# Patient Record
Sex: Female | Born: 1950 | Race: White | Hispanic: No | Marital: Married | Smoking: Former smoker
Health system: Southern US, Community
[De-identification: ages and names within clinical notes are randomized; demographics above are authoritative.]

## PROBLEM LIST (undated history)

## (undated) DIAGNOSIS — I10 Essential (primary) hypertension: Secondary | ICD-10-CM

## (undated) DIAGNOSIS — N83209 Unspecified ovarian cyst, unspecified side: Secondary | ICD-10-CM

## (undated) DIAGNOSIS — Z9889 Other specified postprocedural states: Secondary | ICD-10-CM

## (undated) DIAGNOSIS — D649 Anemia, unspecified: Secondary | ICD-10-CM

## (undated) DIAGNOSIS — K219 Gastro-esophageal reflux disease without esophagitis: Secondary | ICD-10-CM

## (undated) DIAGNOSIS — E669 Obesity, unspecified: Secondary | ICD-10-CM

## (undated) DIAGNOSIS — T4145XA Adverse effect of unspecified anesthetic, initial encounter: Secondary | ICD-10-CM

## (undated) DIAGNOSIS — K635 Polyp of colon: Secondary | ICD-10-CM

## (undated) DIAGNOSIS — F329 Major depressive disorder, single episode, unspecified: Secondary | ICD-10-CM

## (undated) DIAGNOSIS — K8020 Calculus of gallbladder without cholecystitis without obstruction: Secondary | ICD-10-CM

## (undated) DIAGNOSIS — M797 Fibromyalgia: Secondary | ICD-10-CM

## (undated) DIAGNOSIS — Z889 Allergy status to unspecified drugs, medicaments and biological substances status: Secondary | ICD-10-CM

## (undated) DIAGNOSIS — R112 Nausea with vomiting, unspecified: Secondary | ICD-10-CM

## (undated) DIAGNOSIS — Z86010 Personal history of colonic polyps: Secondary | ICD-10-CM

## (undated) DIAGNOSIS — Z87440 Personal history of urinary (tract) infections: Secondary | ICD-10-CM

## (undated) DIAGNOSIS — F419 Anxiety disorder, unspecified: Secondary | ICD-10-CM

## (undated) DIAGNOSIS — K760 Fatty (change of) liver, not elsewhere classified: Secondary | ICD-10-CM

## (undated) DIAGNOSIS — M5430 Sciatica, unspecified side: Secondary | ICD-10-CM

## (undated) DIAGNOSIS — G4700 Insomnia, unspecified: Secondary | ICD-10-CM

## (undated) DIAGNOSIS — F32A Depression, unspecified: Secondary | ICD-10-CM

## (undated) DIAGNOSIS — Q249 Congenital malformation of heart, unspecified: Secondary | ICD-10-CM

## (undated) DIAGNOSIS — T8859XA Other complications of anesthesia, initial encounter: Secondary | ICD-10-CM

## (undated) DIAGNOSIS — L309 Dermatitis, unspecified: Secondary | ICD-10-CM

## (undated) DIAGNOSIS — K759 Inflammatory liver disease, unspecified: Secondary | ICD-10-CM

## (undated) HISTORY — DX: Anemia, unspecified: D64.9

## (undated) HISTORY — PX: LAPAROSCOPIC OOPHORECTOMY: SUR783

## (undated) HISTORY — DX: Unspecified ovarian cyst, unspecified side: N83.209

## (undated) HISTORY — DX: Congenital malformation of heart, unspecified: Q24.9

## (undated) HISTORY — DX: Fatty (change of) liver, not elsewhere classified: K76.0

## (undated) HISTORY — PX: LAPAROSCOPY: SHX197

## (undated) HISTORY — DX: Major depressive disorder, single episode, unspecified: F32.9

## (undated) HISTORY — DX: Obesity, unspecified: E66.9

## (undated) HISTORY — DX: Polyp of colon: K63.5

## (undated) HISTORY — DX: Insomnia, unspecified: G47.00

## (undated) HISTORY — DX: Fibromyalgia: M79.7

## (undated) HISTORY — DX: Depression, unspecified: F32.A

## (undated) HISTORY — DX: Essential (primary) hypertension: I10

## (undated) HISTORY — DX: Hemochromatosis, unspecified: E83.119

## (undated) HISTORY — DX: Calculus of gallbladder without cholecystitis without obstruction: K80.20

## (undated) HISTORY — DX: Inflammatory liver disease, unspecified: K75.9

---
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
## 1970-03-29 HISTORY — PX: WISDOM TOOTH EXTRACTION: SHX21

## 1992-03-29 HISTORY — PX: APPENDECTOMY: SHX54

## 2000-03-14 ENCOUNTER — Encounter: Payer: Self-pay | Admitting: Surgery

## 2000-03-17 ENCOUNTER — Observation Stay (HOSPITAL_COMMUNITY): Admission: RE | Admit: 2000-03-17 | Discharge: 2000-03-17 | Payer: Self-pay | Admitting: Surgery

## 2000-03-17 ENCOUNTER — Encounter (INDEPENDENT_AMBULATORY_CARE_PROVIDER_SITE_OTHER): Payer: Self-pay | Admitting: Specialist

## 2000-03-17 ENCOUNTER — Encounter: Payer: Self-pay | Admitting: Surgery

## 2000-03-17 HISTORY — PX: LAPAROSCOPIC CHOLECYSTECTOMY: SUR755

## 2000-08-01 ENCOUNTER — Other Ambulatory Visit: Admission: RE | Admit: 2000-08-01 | Discharge: 2000-08-01 | Payer: Self-pay | Admitting: Gynecology

## 2002-09-19 ENCOUNTER — Encounter: Payer: Self-pay | Admitting: Gynecology

## 2002-09-19 ENCOUNTER — Ambulatory Visit (HOSPITAL_COMMUNITY): Admission: RE | Admit: 2002-09-19 | Discharge: 2002-09-19 | Payer: Self-pay | Admitting: Gynecology

## 2003-10-07 ENCOUNTER — Other Ambulatory Visit: Admission: RE | Admit: 2003-10-07 | Discharge: 2003-10-07 | Payer: Self-pay | Admitting: Gynecology

## 2009-05-08 ENCOUNTER — Encounter: Admission: RE | Admit: 2009-05-08 | Discharge: 2009-05-08 | Payer: Self-pay | Admitting: Gynecology

## 2009-08-18 ENCOUNTER — Encounter: Admission: RE | Admit: 2009-08-18 | Discharge: 2009-08-18 | Payer: Self-pay | Admitting: Internal Medicine

## 2009-09-04 ENCOUNTER — Encounter: Admission: RE | Admit: 2009-09-04 | Discharge: 2009-09-04 | Payer: Self-pay | Admitting: Internal Medicine

## 2010-05-14 ENCOUNTER — Other Ambulatory Visit: Payer: Self-pay | Admitting: Gynecology

## 2010-05-14 DIAGNOSIS — R1900 Intra-abdominal and pelvic swelling, mass and lump, unspecified site: Secondary | ICD-10-CM

## 2010-05-14 DIAGNOSIS — R102 Pelvic and perineal pain: Secondary | ICD-10-CM

## 2010-05-19 ENCOUNTER — Other Ambulatory Visit: Payer: Self-pay

## 2010-05-22 ENCOUNTER — Ambulatory Visit
Admission: RE | Admit: 2010-05-22 | Discharge: 2010-05-22 | Disposition: A | Discharge status: Home / Self Care | Payer: 59 | Source: Ambulatory Visit | Attending: Gynecology | Admitting: Gynecology

## 2010-05-22 DIAGNOSIS — R102 Pelvic and perineal pain: Secondary | ICD-10-CM

## 2010-05-22 DIAGNOSIS — R1021 Pelvic and perineal pain right side: Secondary | ICD-10-CM

## 2010-05-22 DIAGNOSIS — R1900 Intra-abdominal and pelvic swelling, mass and lump, unspecified site: Secondary | ICD-10-CM

## 2010-07-08 ENCOUNTER — Encounter (HOSPITAL_COMMUNITY)
Admission: RE | Admit: 2010-07-08 | Discharge: 2010-07-08 | Disposition: A | Discharge status: Home / Self Care | Payer: 59 | Source: Ambulatory Visit | Attending: Obstetrics and Gynecology | Admitting: Obstetrics and Gynecology

## 2010-07-08 LAB — COMPREHENSIVE METABOLIC PANEL
ALT: 35 U/L (ref 0–35)
AST: 42 U/L — ABNORMAL HIGH (ref 0–37)
BUN: 10 mg/dL (ref 6–23)
CO2: 24 mEq/L (ref 19–32)
Chloride: 103 mEq/L (ref 96–112)
GFR calc Af Amer: >60 mL/min (ref >60)
GFR calc non Af Amer: >60 mL/min (ref >60)
Potassium: 3.7 mEq/L (ref 3.5–5.1)
Sodium: 135 mEq/L (ref 135–145)
Total Bilirubin: 0.4 mg/dL (ref 0.3–1.2)
Total Protein: 7.3 g/dL (ref 6.0–8.3)

## 2010-07-08 LAB — CBC
Platelets: 231 10*3/uL (ref 150–400)
RBC: 4.52 MIL/uL (ref 3.87–5.11)
WBC: 9 10*3/uL (ref 4.0–10.5)

## 2010-07-08 LAB — SURGICAL PCR SCREEN
MRSA, PCR: NEGATIVE
Staphylococcus aureus: NEGATIVE

## 2010-07-08 LAB — URINALYSIS, ROUTINE W REFLEX MICROSCOPIC
Hgb urine dipstick: NEGATIVE
Specific Gravity, Urine: <1.005 — ABNORMAL LOW (ref 1.005–1.030)
pH: 6 (ref 5.0–8.0)

## 2010-07-15 ENCOUNTER — Inpatient Hospital Stay (HOSPITAL_COMMUNITY)
Admission: RE | Admit: 2010-07-15 | Discharge: 2010-07-17 | DRG: 743 | Disposition: A | Discharge status: Home / Self Care | Payer: 59 | Source: Ambulatory Visit | Attending: Obstetrics and Gynecology | Admitting: Obstetrics and Gynecology

## 2010-07-15 ENCOUNTER — Other Ambulatory Visit: Payer: Self-pay | Admitting: Obstetrics and Gynecology

## 2010-07-15 DIAGNOSIS — N9489 Other specified conditions associated with female genital organs and menstrual cycle: Secondary | ICD-10-CM | POA: Diagnosis present

## 2010-07-15 DIAGNOSIS — Z01812 Encounter for preprocedural laboratory examination: Secondary | ICD-10-CM

## 2010-07-15 DIAGNOSIS — D279 Benign neoplasm of unspecified ovary: Principal | ICD-10-CM | POA: Diagnosis present

## 2010-07-15 HISTORY — PX: EXPLORATORY LAPAROTOMY: SUR591

## 2010-07-15 LAB — HEMOGLOBIN AND HEMATOCRIT, BLOOD
HCT: 37.2 % (ref 36.0–46.0)
Hemoglobin: 13 g/dL (ref 12.0–15.0)

## 2010-07-16 LAB — CBC
HCT: 35.3 % — ABNORMAL LOW (ref 36.0–46.0)
MCHC: 33.4 g/dL (ref 30.0–36.0)
RDW: 12.2 % (ref 11.5–15.5)

## 2010-07-17 NOTE — Op Note (Signed)
NAME:  Miranda Stark, Miranda Stark                 ACCOUNT NO.:  [id number]  MEDICAL RECORD NO.:  [id number]           PATIENT TYPE:  I  LOCATION:  9316                          FACILITY:  [facility]  PHYSICIAN:  Miguel Aschoff, M.D.       DATE OF BIRTH:  Aug 23, 1950  DATE OF PROCEDURE:  07/15/2010 DATE OF DISCHARGE:                              OPERATIVE REPORT   PREOPERATIVE DIAGNOSIS:  A 6-cm right adnexal mass.  POSTOPERATIVE DIAGNOSIS:  A 6-cm right adnexal mass.  PROCEDURES: 1. Exploratory laparotomy. 2. Lysis of adhesions. 3. Bilateral salpingo-oophorectomy.  SURGEON:  Miguel Aschoff, MD  ASSISTANT:  Luvenia Redden, MD  ANESTHESIA:  General.  COMPLICATIONS:  None.  JUSTIFICATION:  The patient is a 60 year old white female, noted to have what appeared to be a simple cyst involving the right ovary which had been followed, however, the cyst has progressed in size and now it reached 6 cm.  The patient was evaluated on an outpatient basis.  No other abnormalities were noted during the evaluation.  Her CA-125 was noted to be within normal limits.  However, because of her age and persistence of this mass, she is now going to the operating room to undergo exploratory laparotomy and bilateral salpingo-oophorectomy to ensure that this does not represent significant ovarian neoplasia. Risks and benefits of this procedure were discussed with the patient. Informed consent has been obtained.  DESCRIPTION OF PROCEDURE:  The patient was taken to the operating room and placed in the supine position.  General anesthesia was administered without difficulty.  She was then prepped and draped in the usual sterile fashion.  Foley catheter was inserted.  Once this was done, attention was directed to the abdomen where a midline incision was made, extending from the symphysis pubis to the umbilicus.  This was extended out through the subcutaneous tissue with bleeding points being clamped and coagulated as they were  encountered.  The fascia was then identified and incised vertically, it was then separated from the underlying rectus muscles.  The rectus muscles were divided in the midline.  The peritoneum found and entered carefully, avoiding underlying structures. On entering the peritoneum, washings were taken and sent for cytology. Self-retaining retractor was then placed through the wound and the viscera packed out of pelvis.  It was obvious at this point, the patient had a smooth again 6-7 cm mass involving the right ovary.  There were adhesions of the appendices epiploicae onto the mass and the mass was adherent to the lateral pelvic sidewall on the posterior surface of the ovary.  There were no external excrescences noted.  No other abnormalities found.  At this point, the adhesions holding the ovary to the appendices epiploicae were taken down without difficulty and the ovary was freed from the sidewall and then an avascular region between the infundibulopelvic ligament and round ligament space was opened.  The ureter was identified with the ureter being out of field.  The infundibulopelvic ligament was doubly clamped, cut, and doubly ligated using ligatures of 0 Vicryl.  Dissection was then carried out medially, freeing the residual tissue, holding the  ovary to the lateral pelvic sidewall without difficulty.  The specimen was then freed intact and sent for histologic study.  Attention was then directed to the left side, again this was very small ovary, again adherent to the lateral pelvic sidewall, again appendices epiploicae were adherent to this mass and to the sidewall, and it was necessary to lyse these adhesions before proceeding with the left oophorectomy.  Once the adhesions were lysed again, the space between the round ligament and the infundibulopelvic ligament was opened.  The ureter identified and again the infundibulopelvic ligament was then clamped, cut, and doubly ligated using  ligatures of 0 Vicryl.  Once again, the dissection was carried medially and freeing the ovary and tube from the pelvic sidewall and peritoneum again with care to avoid any injury to the great vessels or the ureter.  Once the ovary was freed, inspection was made for hemostasis.  There were 2 areas of oozing noted.  These were promptly brought under control with electrocautery.  At this point with excellent hemostasis, there were no other abnormalities being noted.  The pelvis was irrigated with warm saline.  Lap counts were taken and found to be correct and the abdomen was closed.  The parietal peritoneum was closed using running continuous 0 Vicryl suture.  The fascia was closed using a 0 PDS double-stranded suture in a continuous running fashion.  The subcutaneous tissue was closed using interrupted 2-0 plain gut and skin incision was closed using staples.  Estimated blood loss was less than 100 mL.  The patient tolerated the procedure well and went to the recovery room in satisfactory condition.     Miguel Aschoff, M.D.     AR/MEDQ  D:  07/15/2010  T:  07/15/2010  Job:  [id number]  Electronically Signed by Miguel Aschoff M.D. on 07/17/2010 01:53:24 PM

## 2010-07-21 NOTE — Discharge Summary (Signed)
Miranda Stark, Miranda Stark                 ACCOUNT NO.:  [id number]  MEDICAL RECORD NO.:  [id number]           PATIENT TYPE:  I  LOCATION:  9316                          FACILITY:  [facility]  PHYSICIAN:  Miguel Aschoff, M.D.       DATE OF BIRTH:  1950/04/16  DATE OF ADMISSION:  07/15/2010 DATE OF DISCHARGE:  07/17/2010                              DISCHARGE SUMMARY   ADMISSION DIAGNOSIS:  Complex right adnexal mass.  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  OPERATIONS AND PROCEDURES:  Exploratory laparotomy, lysis of adhesions, bilateral salpingo-oophorectomy, general anesthesia.  BRIEF HISTORY:  The patient is a 60 year old white female, patient of Dr. Lodema Hong, who was noted on examination to have a fullness in the right adnexa which on ultrasound proved to be a cystic mass involving the right ovary.  This had been observed over a period of time with CA- 125 levels which were normal.  However on the patient's most recent exam, this mass was noted to have increased in size to 7 cm.  Because of it being now felt to be an ovarian neoplasm and not representing any simple kind of ovarian cyst, it was felt that exploratory laparotomy was indicated to rule out significant ovarian neoplasia.  Thus, the patient was therefore brought to the [facility] to undergo exploratory laparotomy.  HOSPITAL COURSE:  Preoperative studies were obtained.  This include admission hemoglobin of 14.5, hematocrit 42.0, white count was 9000 and urinalysis was negative.  PT/PTT were within normal limits. Comprehensive metabolic panel was normal except for slight elevation of the glucose at 122 and the SGOT is 42.  Remaining values were normal. Surgical screening for MRSA and staph aureus were negative.  HOSPITAL COURSE:  Under general anesthesia on July 15, 2010, the patient underwent exploratory laparotomy and the patient was found to have a large smooth mass  involving the right ovary adherent somewhat to the right lateral pelvic sidewall approximately 7-8 cm in size.  This was removed intact and sent for histologic study.  She was noted to have a normal atrophic left ovary that was near to the pelvic sidewall and this was removed also at the time of surgery.  The patient's postoperative course was essentially uncomplicated.  She tolerated with increased ambulation and diet well.  Her hemoglobin remained stable.  By the second postoperative day, she was felt to be in satisfactory condition and will be discharged home.  She is tolerating regular diet on p.o. and pain medications.  The patient was sent home on Tylox 1 every 3 hours as needed for pain.  She was instructed no heavy lifting, place nothing in the vagina, to call if there are any problems such as fever, pain or heavy bleeding.  The patient will be seen back in 4 weeks for followup examination.  The pathology report on the ovarian specimen revealed a 7.3 x 6.5 x 5.0 cystic ovarian mass which on sectioning a microscopic study proved to be a benign serous cystadenofibroma.  There was no evidence of malignancy.  Cytology on the abdominal washings was negative.  The patient was sent home on a regular diet in satisfactory condition. Again she will be seen back in 4 weeks for followup examination.     Miguel Aschoff, M.D.     AR/MEDQ  D:  07/17/2010  T:  07/18/2010  Job:  [id number]  Electronically Signed by Miguel Aschoff M.D. on 07/21/2010 09:32:23 AM

## 2010-08-14 NOTE — Op Note (Signed)
[place]  Patient:    NIMCO, BIVENS                        MRN: [id number] Proc. Date: 03/17/00 Adm. Date:  [id number] Disc. Date: [id number] Attending:  Andre Lefort CC:         Janae Bridgeman. Eloise Harman., M.D.   Operative Report  DATE OF BIRTH:  Feb 27, 1951  PREOPERATIVE DIAGNOSIS:  Biliary colic with gallstones.  POSTOPERATIVE DIAGNOSIS:  Chronic cholecystitis with cholelithiasis.  OPERATION PERFORMED:  Laparoscopic cholecystectomy with intraoperative cholangiogram.  SURGEON:  Sandria Bales. Ezzard Standing, M.D.  ASSISTANTMarcial Pacas E. Earlene Plater, M.D.  ANESTHESIA:  General endotracheal.  ESTIMATED BLOOD LOSS:  Minimal.  INDICATIONS FOR PROCEDURE:  Ms. Bolen is a 60 year old white female who has had some vague epigastric pain with a gallbladder ultrasound which shows gallstones.  She now comes for attempted laparoscopic cholecystectomy. Indications and complications were discussed with the patient preoperatively.  DESCRIPTION OF PROCEDURE:  The patient was placed in a supine position and given a general endotracheal anesthetic as supervised by Dr. Lenard Lance.  She was given 1 gm of Ancef at the initiation of the procedure.  She had PAS stockings in place.  Her abdomen was prepped with Betadine solution and sterilely draped.  An infraumbilical incision was made with sharp dissection carried down to the abdominal cavity.  A 12 mm Hasson trocar was inserted into the abdominal cavity and secured with a 0 Vicryl suture.  A 10 mm 0 degree scope was inserted into the abdominal cavity and abdominal exploration carried out.  The right and left lobes of the liver were unremarkable.  The anterior wall of the stomach was unremarkable.  The bowel was covered primarily with omentum.  There was no other abnormal mass or lesion within the abdomen.  Three additional trocars were placed with a 10 mm Ethicon trocar in the subxiphoid location and a 5 mm  Ethicon trocar in the right midsubcostal location and a 5 mm Ethicon trocar in the right lateral subcostal location. Each trocar site was infiltrated with 0.25% Marcaine.  The gallbladder was then grasped and rotated cephalad.  Dissection carried out first identifying a lymph node associated with the gallbladder.  Then the main cystic artery was actually anterior to the cystic duct.  This was triply endoclipped and divided.  A single clip was placed on the gallbladder side of the cystic duct and then an intraoperative cholangiogram was obtained.  The intraoperative cholangiogram was done using a cut-off taut catheter inserted through the side of the cut cystic duct.  Half strength Hypaque solution was used and injected under C-arm visualization.  I injected about 8 cc of the Hypaque.  This showed free flow of contrast down the cystic duct into the common bile duct into the duodenum and the contrast refluxed up into the hepatic radicals.  There is no filling defect and no mass.  This was felt to be a normal intraoperative cholangiogram.  The taut catheter was then removed.  The cystic duct was then triply endoclipped and divided.  The gallbladder was then sharply and bluntly dissected from the gallbladder bed.  I used primarily hook Bovie coagulation. Prior to complete division of the gallbladder from the gallbladder bed, I visualized the triangle of Calot. There was no bleeding or bile leak from this.  There was no bleeding or bile leak from the gallbladder bed.  The gallbladder was then  divided, delivered through the umbilicus intact and sent to pathology.  The abdomen was then irrigated.  The umbilical trocar site closed with a 0 Vicryl suture.  The skin at each site was closed with a 5-0 Vicryl suture, painted with tincture of benzoin, steri-stripped and sterilely dressed.  The patient tolerated the procedure well and was transported to the recovery room in good condition.  The  sponge and needle counts were correct at the end of the case. DD:  03/17/00 TD:  03/18/00 Job: [id number]

## 2010-11-25 ENCOUNTER — Encounter: Payer: Self-pay | Admitting: Internal Medicine

## 2011-01-01 ENCOUNTER — Encounter: Payer: Self-pay | Admitting: Internal Medicine

## 2011-01-01 ENCOUNTER — Ambulatory Visit (INDEPENDENT_AMBULATORY_CARE_PROVIDER_SITE_OTHER): Payer: 59 | Admitting: Internal Medicine

## 2011-01-01 DIAGNOSIS — K219 Gastro-esophageal reflux disease without esophagitis: Secondary | ICD-10-CM

## 2011-01-01 DIAGNOSIS — Z1211 Encounter for screening for malignant neoplasm of colon: Secondary | ICD-10-CM

## 2011-01-01 DIAGNOSIS — R1319 Other dysphagia: Secondary | ICD-10-CM

## 2011-01-01 DIAGNOSIS — R1310 Dysphagia, unspecified: Secondary | ICD-10-CM

## 2011-01-01 MED ORDER — PEG-KCL-NACL-NASULF-NA ASC-C 100 G PO SOLR: 1.0000 | Freq: Once | ORAL | Status: DC

## 2011-01-01 MED ORDER — DEXLANSOPRAZOLE 60 MG PO CPDR: 60.0000 mg | DELAYED_RELEASE_CAPSULE | Freq: Every day | ORAL | Status: DC

## 2011-01-01 NOTE — Patient Instructions (Signed)
You have been scheduled for an Endoscopy/Colonoscopy with separate instructions given. Your prep kit has been sent to your pharmacy for you to pick up. Dexilant samples have been given to you today take 1 tablet every day. Take Pepcid Complete as needed.

## 2011-01-01 NOTE — Progress Notes (Signed)
  Subjective:    Patient ID: Miranda Stark, female    DOB: Oct 20, 1950, 60 y.o.   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 She has had many years of symptoms. Reflux is worse - has nocturnal throat symptoms with burning and rare fluid regurgitation and abdomen may even burn. She has tried to eliminate eating close to bed. Has had years of reflux, thesenocturnal problems have been x 3-4 years. There may be a slow or uncomfortable swallow at times. Her weight is up lately. Under a lot of stress - 100 yo mother with mental illness problems and that has been a major issue. Mom is placed but some issues with siblings cleaning out hose, etc. Using Pepcid Complete. She has taken Aciphex, Prilosec and Prevacid in past. They lost efficacy over time. She has been on medications since her 30's. Has also been on Nexium.   Review of Systems Positive for allergies, she's been anxious and stressed over her mother situation. Some depressive symptoms, insomnia. Alert his systems are negative at this time.    Objective:   Physical Exam General: Obese Well-developed, well-nourished and in no acute distress Vitals: Reviewed and listed above Eyes:anicteric. Mouth and posterior pharynx: normal.  Neck: supple w/o thyromegaly or mass.  Lungs: clear. Heart: S1S2, no rubs, murmurs, gallops. Abdomen: obese, soft, non-tender, no hepatosplenomegaly, hernia, or mass and BS+.  Lymphatics: no cervical, Pettit  nodes. Extremities:  no edema Neuro: nonfocal. A&O x 3.  Psych: appropriate mood and  affect.        Assessment & Plan:   1. Dysphagia  Ambulatory referral to Gastroenterology  2. Screening for colon cancer  Ambulatory referral to Gastroenterology   I suspect the mild dysphagia is related to GERD. Trial of PPI samples with Dexilant. EGD with possible esophageal dilation. Risks benefits and indications were explained.  She is routine risk for colorectal cancer, screening colonoscopy  is recommended and she agrees to proceed. Risks benefits and indications are explained.

## 2011-01-21 ENCOUNTER — Telehealth: Payer: Self-pay | Admitting: Internal Medicine

## 2011-01-21 MED ORDER — DEXLANSOPRAZOLE 60 MG PO CPDR: 60.0000 mg | DELAYED_RELEASE_CAPSULE | Freq: Every day | ORAL | Status: AC

## 2011-01-21 NOTE — Telephone Encounter (Signed)
At last office visit patient was given Dexilant sample to try. Patient calling today for a prescription. Medication sent in the pharmacy, #30 with 5 refills. Dr. Leone Payor patient asked to speak directly to you about a previous conversation you had.

## 2011-01-26 NOTE — Telephone Encounter (Signed)
I called patient - personal discussion about scouting.

## 2011-02-03 ENCOUNTER — Ambulatory Visit (AMBULATORY_SURGERY_CENTER): Payer: 59 | Admitting: Internal Medicine

## 2011-02-03 ENCOUNTER — Encounter: Payer: Self-pay | Admitting: Internal Medicine

## 2011-02-03 DIAGNOSIS — R1319 Other dysphagia: Secondary | ICD-10-CM

## 2011-02-03 DIAGNOSIS — K219 Gastro-esophageal reflux disease without esophagitis: Secondary | ICD-10-CM

## 2011-02-03 DIAGNOSIS — D126 Benign neoplasm of colon, unspecified: Secondary | ICD-10-CM

## 2011-02-03 DIAGNOSIS — Z1211 Encounter for screening for malignant neoplasm of colon: Secondary | ICD-10-CM

## 2011-02-03 DIAGNOSIS — R1310 Dysphagia, unspecified: Secondary | ICD-10-CM

## 2011-02-03 HISTORY — PX: COLONOSCOPY: SHX174

## 2011-02-03 HISTORY — PX: UPPER GASTROINTESTINAL ENDOSCOPY: SHX188

## 2011-02-03 MED ORDER — SODIUM CHLORIDE 0.9 % IV SOLN: 500.0000 mL | INTRAVENOUS | Status: DC

## 2011-02-03 NOTE — Progress Notes (Signed)
No complaints noted in the recovery room. maw 

## 2011-02-03 NOTE — Patient Instructions (Addendum)
1) The esophagus, stomach and small intestine looks normal. Given your swallowing problems I went ahead and performed a dilation of the esophagus. Please follow the post dilation instructions the nurse did you. 2) Continue the Dexilant to treat GERD and heartburn. When you need a refill I would consider trying the lower dose of 30 mg. 3) I removed one very small polyp today. The remainder of the colonoscopy was notable for hemorrhoids only.This small polyp is almost certainly benign and I will confirm that with a letter and notification about the timing of repeat colonoscopy. Iva Boop, MD, [facility]   Resume your prior medications today.  Please follow the green and blue discharge instruction sheets the rest of the day.  Please follow the dilation diet the rest of the day also.  Call if any questions or concerns.

## 2011-02-04 ENCOUNTER — Telehealth: Payer: Self-pay | Admitting: *Deleted

## 2011-02-04 NOTE — Telephone Encounter (Signed)

## 2011-02-10 ENCOUNTER — Encounter: Payer: Self-pay | Admitting: Internal Medicine

## 2011-02-10 DIAGNOSIS — Z86010 Personal history of colonic polyps: Secondary | ICD-10-CM | POA: Insufficient documentation

## 2011-06-11 IMAGING — US US PELVIS COMPLETE
1 series · 14 of 25 positions shown · non-contrast
Comparison: None

CLINICAL DATA: Fullness in the right adnexa on pelvic exam,
hysterectomy, patient on hormone replacement therapy

TRANSABDOMINAL AND TRANSVAGINAL ULTRASOUND OF PELVIS
TECHNIQUE: Both transabdominal and transvaginal ultrasound
examinations of the pelvis were performed including evaluation of
the uterus, ovaries, adnexal regions, and pelvic cul-de-sac.

[Series 1: us pelvis complete · 0.30mm/px · 14 of 37 slices shown]
[im 1/37]
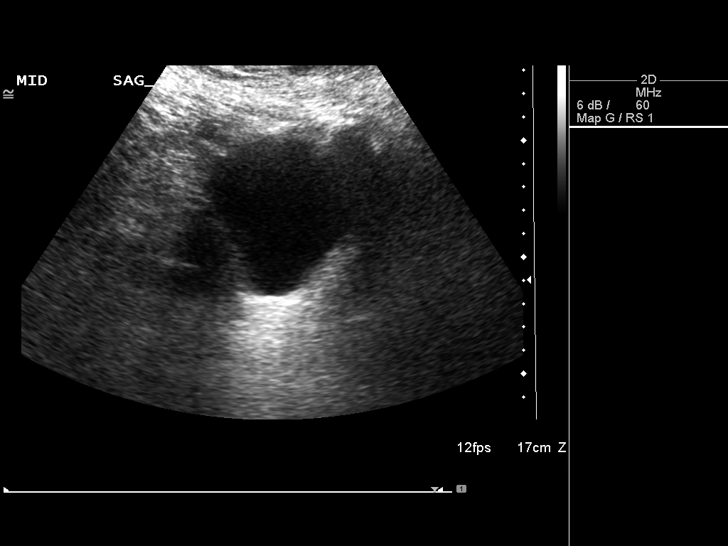
[im 4/37]
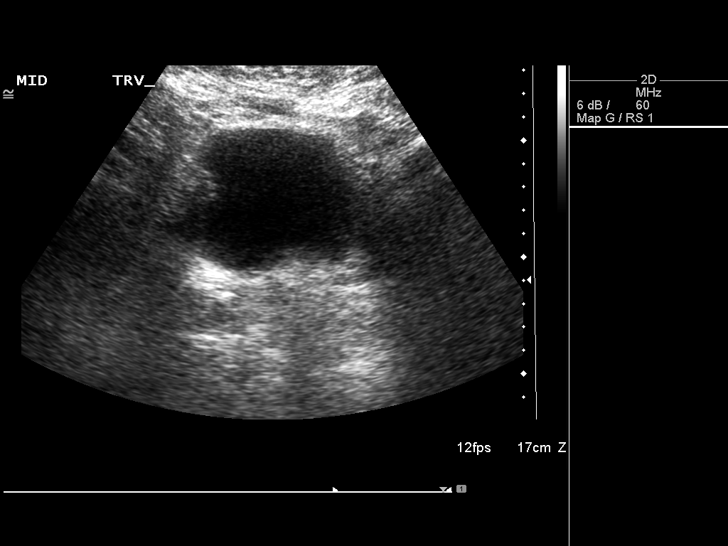
[im 7/37]
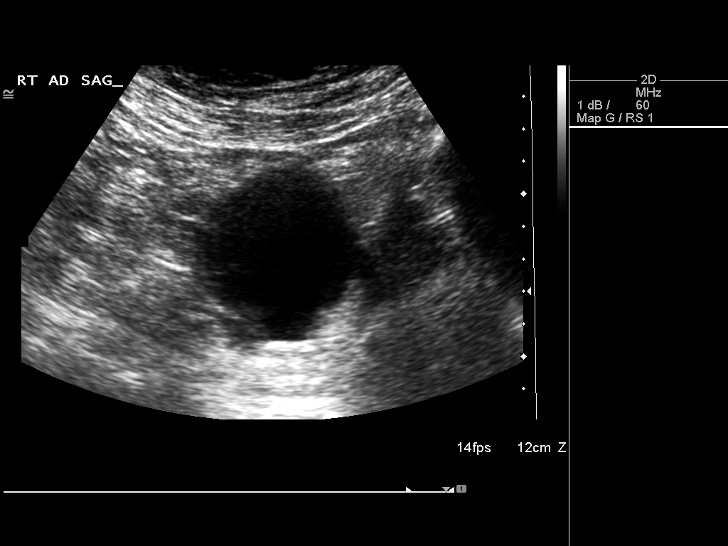
[im 10/37]
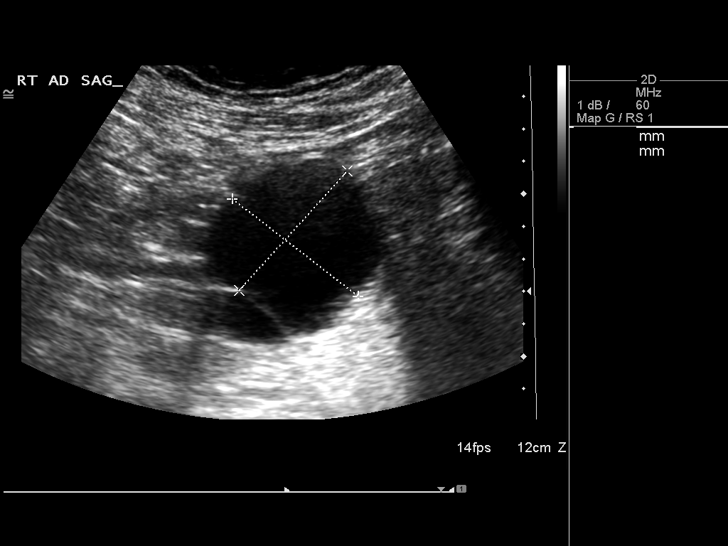
[im 13/37]
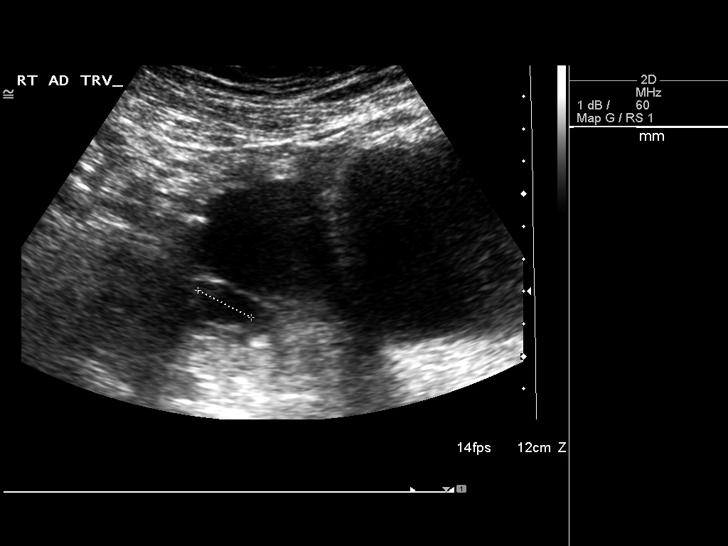
[im 14/37]
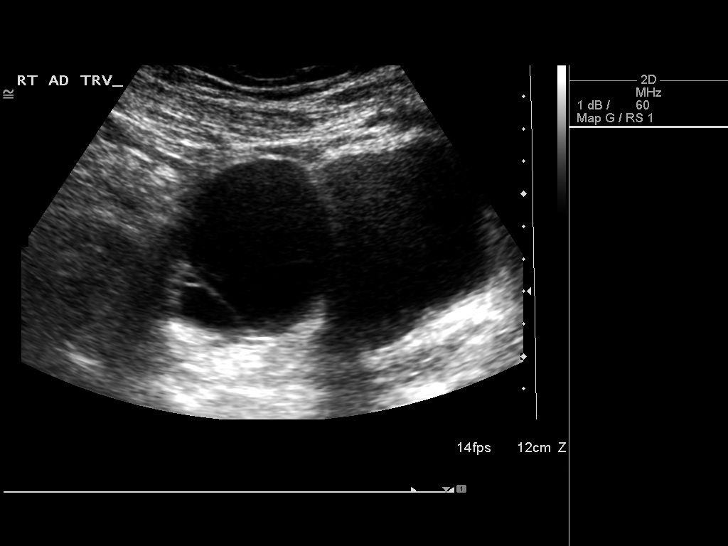
[im 17/37]
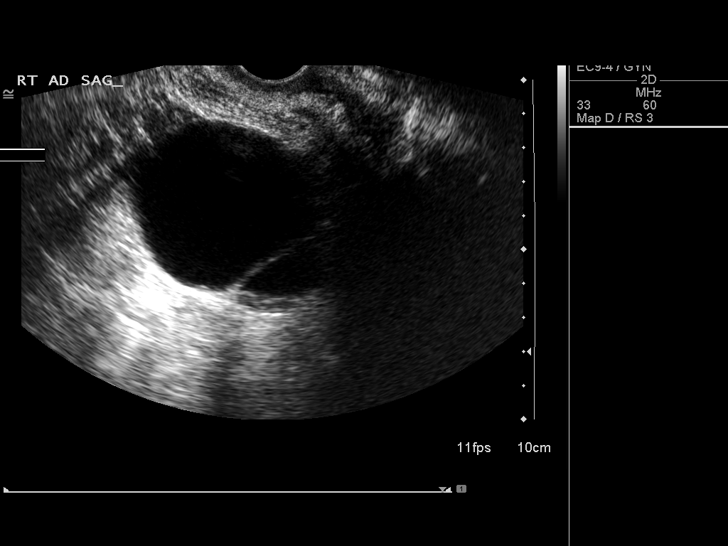
[im 20/37]
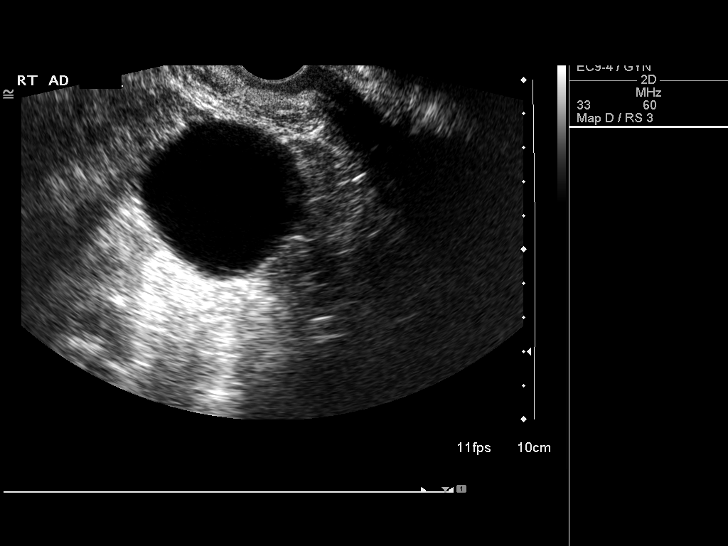
[im 23/37]
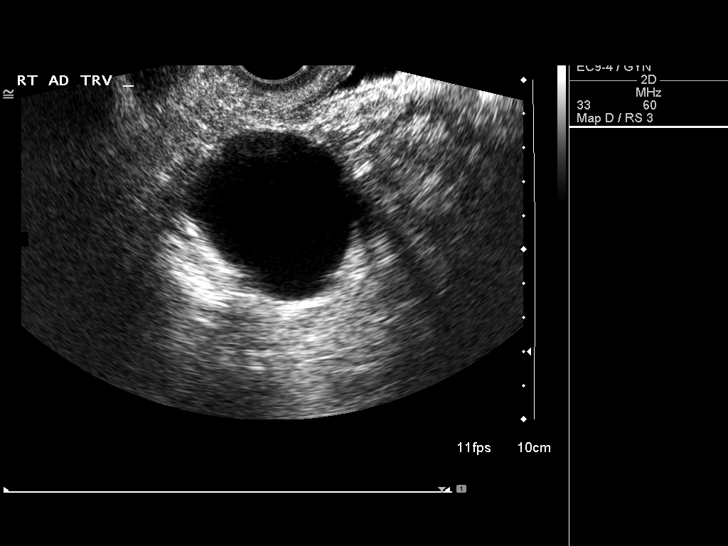
[im 25/37]
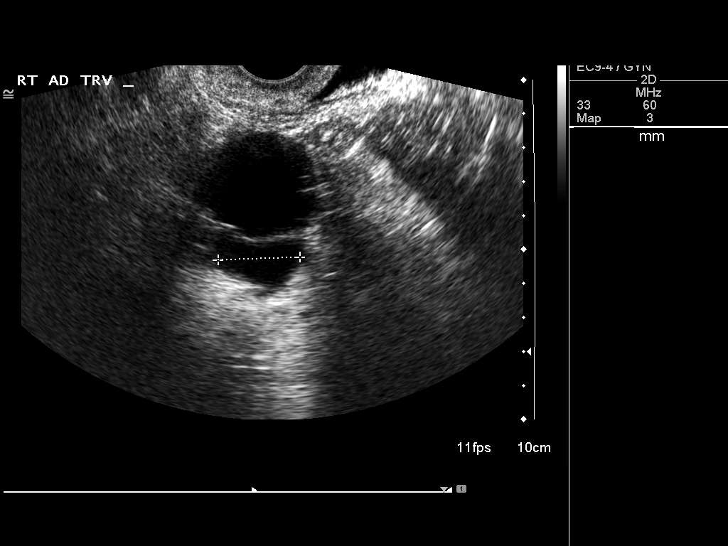
[im 28/37]
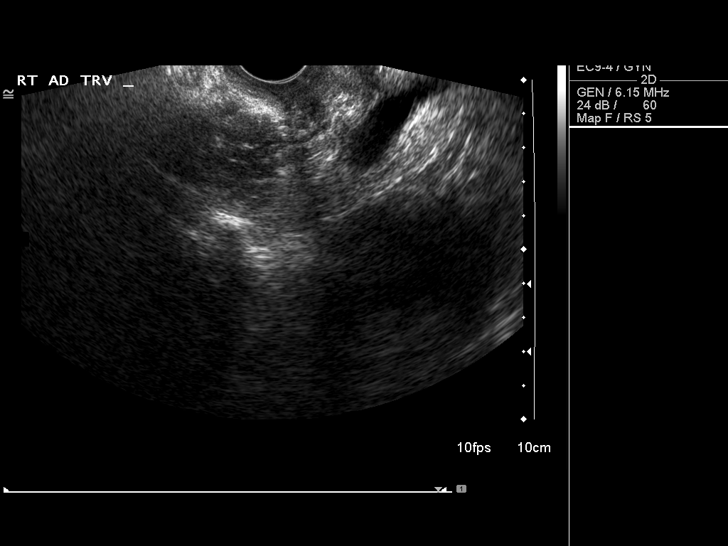
[im 31/37]
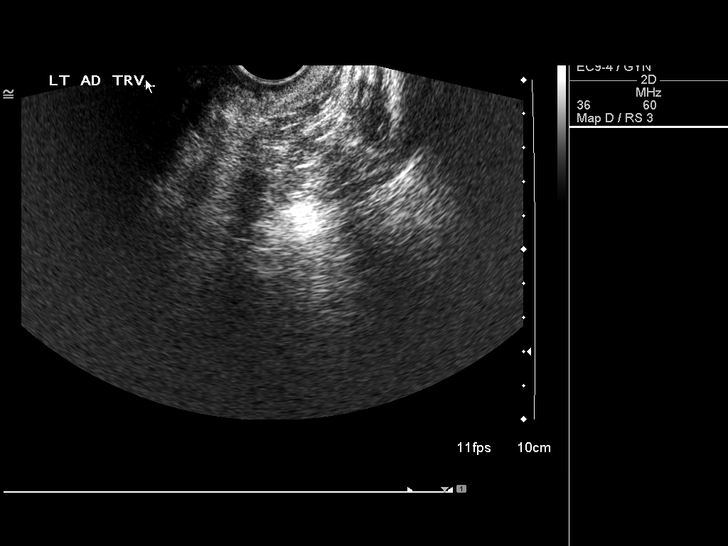
[im 34/37]
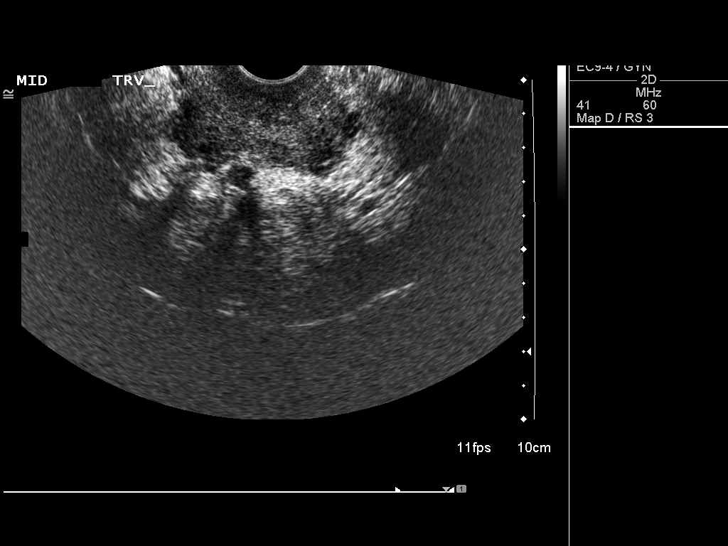
[im 37/37]
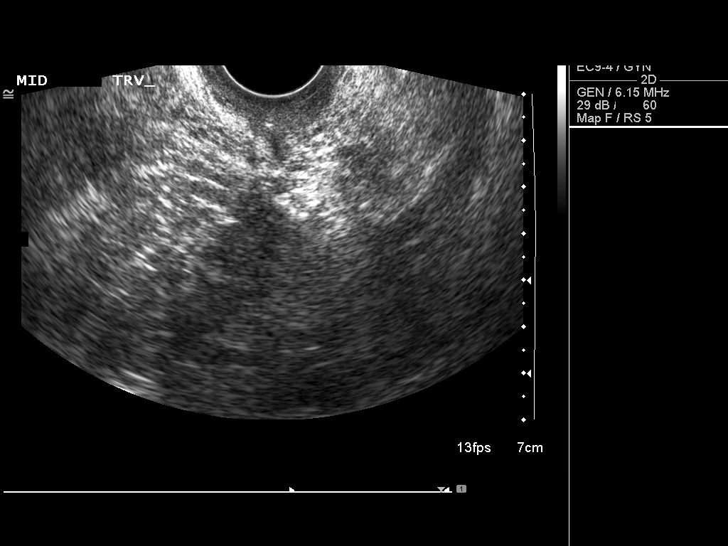

[14 of 25 positions shown; findings below may reference images not displayed]

FINDINGS: Uterus: The uterus has been resected previously.

Endometrium:Not applicable

Right Ovary :Two cystic structures are noted in the right adnexa.
The larger measures 5.2 x 4.7 x 4.9 cm without evidence of
complicating features.  The smaller cystic structure measures 2.5 x
1.4 x 2.4 cm, also without evidence of a complicating features.  No
solid component is seen.

Left Ovary :The left ovary is not visualized .

Other Findings:  No free fluid is seen.
IMPRESSION: Two adjacent right adnexal cysts, the larger measuring 5.2 cm
without complicating features.

## 2012-11-02 ENCOUNTER — Encounter: Payer: Self-pay | Admitting: Internal Medicine

## 2012-11-03 ENCOUNTER — Other Ambulatory Visit: Payer: 59

## 2012-11-03 ENCOUNTER — Ambulatory Visit (INDEPENDENT_AMBULATORY_CARE_PROVIDER_SITE_OTHER): Payer: 59 | Admitting: Internal Medicine

## 2012-11-03 ENCOUNTER — Encounter: Payer: Self-pay | Admitting: Internal Medicine

## 2012-11-03 VITALS — BP 312/86 | HR 90 | Ht 64.0 in | Wt 196.0 lb

## 2012-11-03 DIAGNOSIS — J309 Allergic rhinitis, unspecified: Secondary | ICD-10-CM

## 2012-11-03 DIAGNOSIS — J441 Chronic obstructive pulmonary disease with (acute) exacerbation: Secondary | ICD-10-CM

## 2012-11-03 NOTE — Patient Instructions (Addendum)
Ok to continue azelastine and a nasal steroid like Rhinocort or otc Nasacort  Order- lab- Allergy Profile, Food IgE profile   Dx allergic rhinitis

## 2012-11-03 NOTE — Progress Notes (Signed)
11/03/12- 61 yoFformer smoker Referred by Dr. Pearson Grippe.  recurring congestion, runny nose, and nonprod cough.  Was social smoker quitting 2 years ago. Long history of seasonal allergic rhinitis without asthma. Was on allergy vaccine for 6 or 8 years by Dr. Rema Jasmine in the 1980s. Starting in October of 2013 she has had recurrent rhinorrhea, nasal congestion and green discharge. Episodes clearer but returned every few months. A similar pattern in the 1980s seemed to respond to allergy vaccine. She had spent a year or more renovating a house with associated dust exposure. Still has some hacking cough. Has tried nasal sprays and antihistamines. Recently using Claritin, Azelastine nasal spray and Rhinocort nasal spray. Using Neti pot. On doxycycline now for RMSF after tick bite. History of eczema. Beef and peas cause nausea. No sensitivity to insect stings, latex, aspirin. Most recent prednisone was in June of 2014. Married, retired Engineer, civil (consulting). Mother was broadly atopic, especially to foods. Prior to Admission medications   Medication Sig Start Date End Date Taking? Authorizing Provider  aspirin 81 MG tablet Take 81 mg by mouth daily.     Yes Historical Provider, MD  azelastine (ASTELIN) 137 MCG/SPRAY nasal spray Place 1 spray into the nose 2 (two) times daily. Use in each nostril as directed   Yes Historical Provider, MD  budesonide (RHINOCORT AQUA) 32 MCG/ACT nasal spray Place 1 spray into the nose daily.    Yes Historical Provider, MD  [facility] CINNAMON PO Take 2 capsules by mouth daily.   Yes Historical Provider, MD  cyclobenzaprine (FLEXERIL) 10 MG tablet Take 10 mg by mouth as needed.     Yes Historical Provider, MD  Famotidine-Ca Carb-Mag Hydrox (PEPCID COMPLETE PO) Take by mouth as needed.    Yes Historical Provider, MD  loratadine (CLARITIN) 10 MG tablet Take 10 mg by mouth daily.     Yes Historical Provider, MD  LORazepam (ATIVAN) 1 MG tablet Take 1 mg by mouth at bedtime as needed.    Yes Historical  Provider, MD  losartan (COZAAR) 50 MG tablet Take 50 mg by mouth daily.     Yes Historical Provider, MD  metFORMIN (GLUCOPHAGE) 500 MG tablet Take 0.5 tablets by mouth daily as needed.   Yes Historical Provider, MD  b complex vitamins tablet Take 1 tablet by mouth. Takes only Oct - April    Historical Provider, MD  Cetirizine HCl (ZYRTEC ALLERGY PO) Take by mouth.     Historical Provider, MD  Cholecalciferol (VITAMIN D) 2000 UNITS tablet Take 2,000 Units by mouth. Takes only Oct - April    Historical Provider, MD   Past Medical History  Diagnosis Date  . Insomnia   . Ovarian cyst   . Fatty liver   . HTN (hypertension)   . Allergic rhinitis   . Hepatitis   . Anemia   . Cardiac arrhythmia due to congenital heart disease   . Depression   . Fibromyalgia   . Gallstones   . Obesity    Past Surgical History  Procedure Laterality Date  . Laparoscopic cholecystectomy  03/17/00    with intraoperative cholangiogram.  . Exploratory laparotomy   07/15/2010      Lysis of adhesions, Bilateral salpingo-oophorectomy.   Marland Kitchen Appendectomy  1994  . Laparoscopic oophorectomy    . Colonoscopy  02/03/11    diminutive adenoma and hemorrhoids  . Upper gastrointestinal endoscopy  02/03/11    54 Fr Maloney dilation (looked normal but dysphagia)   Family History  Problem Relation Age of  Onset  . Diabetes Mother     sister  . Hypertension Mother     sister  . Lung disease Mother   . Osteoporosis Mother     brother  . Basal cell carcinoma Mother   . Mental illness Mother   . Kidney failure Brother   . Sleep apnea Sister     mother   History   Social History  . Marital Status: Married    Spouse Name: N/A    Number of Children: 1  . Years of Education: N/A   Occupational History  . retired    Social History Main Topics  . Smoking status: Former Smoker -- 0.20 packs/day for 8 years    Types: Cigarettes    Quit date: 03/29/2010  . Smokeless tobacco: Never Used  . Alcohol Use: Yes      Comment: ocassionaly  . Drug Use: No  . Sexual Activity: Not on file   Other Topics Concern  . Not on file   Social History Narrative   1-2 caffeine drinks a day    ROS-see HPI Constitutional:   No-   weight loss, night sweats, fevers, chills, fatigue, lassitude. HEENT:   No-  headaches, difficulty swallowing, tooth/dental problems, sore throat,       No-  sneezing, itching, ear ache, nasal congestion, post nasal drip,  CV:  No-   chest pain, orthopnea, PND, swelling in lower extremities, anasarca, dizziness, palpitations Resp: No-   shortness of breath with exertion or at rest.              No-   productive cough, + non-productive cough,  No- coughing up of blood.              No-   change in color of mucus.  No- wheezing.   Skin: No-   rash or lesions. GI:  No-   heartburn, indigestion, abdominal pain, nausea, vomiting,  GU:  MS:  No-   joint pain or swelling.   Neuro-     nothing unusual Psych:  No- change in mood or affect. No depression or anxiety.  No memory loss.  OBJ- Physical Exam General- Alert, Oriented, Affect-appropriate, Distress- none acute, overweight Skin- rash-none, lesions- none, excoriation- none Lymphadenopathy- none Head- atraumatic            Eyes- Gross vision intact, PERRLA, conjunctivae and secretions clear            Ears- Hearing, canals-normal            Nose- +turbinate edema, no-Septal dev, mucus, polyps, erosion, perforation             Throat- Mallampati II , mucosa clear , drainage- none, tonsils- atrophic, Neck- flexible , trachea midline, no stridor , thyroid nl, carotid no bruit Chest - symmetrical excursion , unlabored           Heart/CV- RRR , no murmur , no gallop  , no rub, nl s1 s2                           - JVD- none , edema- none, stasis changes- none, varices- none           Lung- clear to P&A, wheeze- none, cough- none , dullness-none, rub- none           Chest wall-  Abd-  Br/ Gen/ Rectal- Not done, not indicated Extrem-  cyanosis- none, clubbing, none, atrophy- none,  strength- nl Neuro- grossly intact to observation

## 2012-11-06 LAB — ALLERGY FULL PROFILE
Allergen,Goose feathers, e70: <0.1 kU/L
Aspergillus fumigatus, m3: <0.1 kU/L
Bermuda Grass: <0.1 kU/L
Candida Albicans: <0.1 kU/L
Curvularia lunata: <0.1 kU/L
D. farinae: <0.1 kU/L
Fescue: <0.1 kU/L
G005 Rye, Perennial: <0.1 kU/L
Lamb's Quarters: <0.1 kU/L
Oak: <0.1 kU/L
Plantain: <0.1 kU/L
Stemphylium Botryosum: <0.1 kU/L
Timothy Grass: <0.1 kU/L

## 2012-11-06 LAB — ALLERGEN FOOD PROFILE SPECIFIC IGE
Apple: <0.1 kU/L
Egg White IgE: <0.1 kU/L
Milk IgE: <0.1 kU/L
Orange: <0.1 kU/L
Peanut IgE: <0.1 kU/L
Shrimp IgE: <0.1 kU/L
Tuna IgE: <0.1 kU/L

## 2012-11-08 NOTE — Progress Notes (Signed)
Quick Note:  Pt aware of results. ______ 

## 2012-11-16 DIAGNOSIS — J310 Chronic rhinitis: Secondary | ICD-10-CM | POA: Insufficient documentation

## 2012-11-16 DIAGNOSIS — J441 Chronic obstructive pulmonary disease with (acute) exacerbation: Secondary | ICD-10-CM | POA: Insufficient documentation

## 2012-11-16 NOTE — Assessment & Plan Note (Signed)
Plan-lab-allergy profiles, watch for recurrent sinusitis, continue present nasal sprays.

## 2012-11-16 NOTE — Assessment & Plan Note (Addendum)
Plan-allergy profiles 

## 2012-12-22 ENCOUNTER — Encounter: Payer: Self-pay | Admitting: Internal Medicine

## 2012-12-22 ENCOUNTER — Ambulatory Visit (INDEPENDENT_AMBULATORY_CARE_PROVIDER_SITE_OTHER): Payer: 59 | Admitting: Internal Medicine

## 2012-12-22 VITALS — BP 126/76 | HR 83 | Ht 64.0 in | Wt 197.2 lb

## 2012-12-22 DIAGNOSIS — J310 Chronic rhinitis: Secondary | ICD-10-CM

## 2012-12-22 DIAGNOSIS — J441 Chronic obstructive pulmonary disease with (acute) exacerbation: Secondary | ICD-10-CM

## 2012-12-22 NOTE — Progress Notes (Signed)
11/03/12- 61 yoFformer smoker Referred by Dr. Pearson Grippe.  recurring congestion, runny nose, and nonprod cough.  Was social smoker quitting 2 years ago. Long history of seasonal allergic rhinitis without asthma. Was on allergy vaccine for 6 or 8 years by Dr. Rema Jasmine in the 1980s. Starting in October of 2013 she has had recurrent rhinorrhea, nasal congestion and green discharge. Episodes clearer but returned every few months. A similar pattern in the 1980s seemed to respond to allergy vaccine. She had spent a year or more renovating a house with associated dust exposure. Still has some hacking cough. Has tried nasal sprays and antihistamines. Recently using Claritin, Azelastine nasal spray and Rhinocort nasal spray. Using Neti pot. On doxycycline now for RMSF after tick bite. History of eczema. Beef and peas cause nausea. No sensitivity to insect stings, latex, aspirin. Most recent prednisone was in June of 2014. Married, retired Engineer, civil (consulting). Mother was broadly atopic, especially to foods.  12/22/12- 11 yoF former smoker followed for allergic rhinitis, chronic bronchitis, complicated by GERD FOLLOWS FOR: review labs in greater detail with patient; denies any flare ups with congestion, cough, or runny nose.  Gets flu vaccine at work No symptoms flare so far this fall. Last winter infections were unusually frequent. Allergy profile 11/03/2012-negative with total IgE 7.8 and no specific elevations. Food IgE allergy profile 11/03/2012-negative for all tested food groups.  ROS-see HPI Constitutional:   No-   weight loss, night sweats, fevers, chills, fatigue, lassitude. HEENT:   No-  headaches, difficulty swallowing, tooth/dental problems, sore throat,       No-  sneezing, itching, ear ache, nasal congestion, post nasal drip,  CV:  No-   chest pain, orthopnea, PND, swelling in lower extremities, anasarca, dizziness, palpitations Resp: No-   shortness of breath with exertion or at rest.              No-    productive cough, + non-productive cough,  No- coughing up of blood.              No-   change in color of mucus.  No- wheezing.   Skin: No-   rash or lesions. GI:  No-   heartburn, indigestion, abdominal pain, nausea, vomiting,  GU:  MS:  No-   joint pain or swelling.   Neuro-     nothing unusual Psych:  No- change in mood or affect. No depression or anxiety.  No memory loss.  OBJ- Physical Exam General- Alert, Oriented, Affect-appropriate, Distress- none acute, overweight Skin- rash-none, lesions- none, excoriation- none Lymphadenopathy- none Head- atraumatic            Eyes- Gross vision intact, PERRLA, conjunctivae and secretions clear            Ears- Hearing, canals-normal            Nose-clear, no-Septal dev, mucus, polyps, erosion, perforation             Throat- Mallampati II , mucosa clear , drainage- none, tonsils- atrophic, Neck- flexible , trachea midline, no stridor , thyroid nl, carotid no bruit Chest - symmetrical excursion , unlabored           Heart/CV- RRR , no murmur , no gallop  , no rub, nl s1 s2                           - JVD- none , edema- none, stasis changes- none, varices- none  Lung- clear to P&A, wheeze- none, cough- none , dullness-none, rub- none           Chest wall-  Abd-  Br/ Gen/ Rectal- Not done, not indicated Extrem- cyanosis- none, clubbing, none, atrophy- none, strength- nl Neuro- grossly intact to observation

## 2012-12-22 NOTE — Patient Instructions (Addendum)
Please call as needed 

## 2012-12-31 NOTE — Assessment & Plan Note (Signed)
She should probably have a series of recurrent upper respiratory infections rather than allergy. We will watch this winter.

## 2012-12-31 NOTE — Assessment & Plan Note (Signed)
Plan-discussed management of viral respiratory infections.

## 2014-05-23 ENCOUNTER — Emergency Department (HOSPITAL_COMMUNITY)
Admission: EM | Admit: 2014-05-23 | Discharge: 2014-05-23 | Disposition: A | Discharge status: Home / Self Care | Payer: 59 | Attending: Emergency Medicine | Admitting: Emergency Medicine

## 2014-05-23 ENCOUNTER — Emergency Department (HOSPITAL_COMMUNITY): Payer: 59

## 2014-05-23 ENCOUNTER — Encounter (HOSPITAL_COMMUNITY): Payer: Self-pay

## 2014-05-23 DIAGNOSIS — Z862 Personal history of diseases of the blood and blood-forming organs and certain disorders involving the immune mechanism: Secondary | ICD-10-CM | POA: Diagnosis not present

## 2014-05-23 DIAGNOSIS — M545 Low back pain: Secondary | ICD-10-CM | POA: Diagnosis present

## 2014-05-23 DIAGNOSIS — M5431 Sciatica, right side: Secondary | ICD-10-CM | POA: Diagnosis not present

## 2014-05-23 DIAGNOSIS — I10 Essential (primary) hypertension: Secondary | ICD-10-CM | POA: Diagnosis not present

## 2014-05-23 DIAGNOSIS — Z8742 Personal history of other diseases of the female genital tract: Secondary | ICD-10-CM | POA: Diagnosis not present

## 2014-05-23 DIAGNOSIS — G4700 Insomnia, unspecified: Secondary | ICD-10-CM | POA: Insufficient documentation

## 2014-05-23 DIAGNOSIS — E669 Obesity, unspecified: Secondary | ICD-10-CM | POA: Insufficient documentation

## 2014-05-23 DIAGNOSIS — Z8619 Personal history of other infectious and parasitic diseases: Secondary | ICD-10-CM | POA: Diagnosis not present

## 2014-05-23 DIAGNOSIS — M797 Fibromyalgia: Secondary | ICD-10-CM | POA: Insufficient documentation

## 2014-05-23 DIAGNOSIS — Z880 Allergy status to penicillin: Secondary | ICD-10-CM | POA: Diagnosis not present

## 2014-05-23 DIAGNOSIS — F329 Major depressive disorder, single episode, unspecified: Secondary | ICD-10-CM | POA: Diagnosis not present

## 2014-05-23 DIAGNOSIS — Q248 Other specified congenital malformations of heart: Secondary | ICD-10-CM | POA: Insufficient documentation

## 2014-05-23 DIAGNOSIS — Z79899 Other long term (current) drug therapy: Secondary | ICD-10-CM | POA: Insufficient documentation

## 2014-05-23 DIAGNOSIS — Z8719 Personal history of other diseases of the digestive system: Secondary | ICD-10-CM | POA: Diagnosis not present

## 2014-05-23 DIAGNOSIS — M25551 Pain in right hip: Secondary | ICD-10-CM | POA: Diagnosis not present

## 2014-05-23 DIAGNOSIS — Z87891 Personal history of nicotine dependence: Secondary | ICD-10-CM | POA: Diagnosis not present

## 2014-05-23 HISTORY — DX: Sciatica, unspecified side: M54.30

## 2014-05-23 MED ORDER — ONDANSETRON HCL 4 MG/2ML IJ SOLN
4.0000 mg | Freq: Once | INTRAMUSCULAR | Status: DC
  Filled 2014-05-23: qty 2

## 2014-05-23 MED ORDER — MORPHINE SULFATE 4 MG/ML IJ SOLN
4.0000 mg | Freq: Once | INTRAMUSCULAR | Status: AC
  Administered 2014-05-23: 4 mg via INTRAVENOUS
  Filled 2014-05-23: qty 1

## 2014-05-23 MED ORDER — OXYCODONE-ACETAMINOPHEN 5-325 MG PO TABS: 2.0000 | ORAL_TABLET | ORAL | Status: DC | PRN

## 2014-05-23 MED ORDER — DIAZEPAM 5 MG/ML IJ SOLN
5.0000 mg | Freq: Once | INTRAMUSCULAR | Status: AC
  Administered 2014-05-23: 5 mg via INTRAVENOUS
  Filled 2014-05-23: qty 2

## 2014-05-23 MED ORDER — OXYCODONE-ACETAMINOPHEN 5-325 MG PO TABS
2.0000 | ORAL_TABLET | Freq: Once | ORAL | Status: AC
  Administered 2014-05-23: 2 via ORAL
  Filled 2014-05-23: qty 2

## 2014-05-23 MED ORDER — DIAZEPAM 5 MG PO TABS: 10.0000 mg | ORAL_TABLET | Freq: Two times a day (BID) | ORAL | Status: DC

## 2014-05-23 NOTE — ED Provider Notes (Signed)
CSN: [id number]     Arrival date & time 05/23/14  0050 History   First MD Initiated Contact with Patient 05/23/14 0104     Chief Complaint  Patient presents with  . Sciatica  . Back Pain  . right hip pain     (Consider location/radiation/quality/duration/timing/severity/associated sxs/prior Treatment) HPI Comments: Patient is a 64 year old female who presents with sudden onset of lower back pain that started this afternoon when she bent down to pick something up. The pain is sharp and severe and radiates down her right leg. The pain is constant. Movement makes the pain worse. 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 She states this feels like an exacerbation of her sciatic nerve pain. Nothing makes the pain better. Patient has tried Robaxin and an expired Percocet for pain without relief. No associated symptoms. No saddle paresthesias or bladder/bowel incontinence.      Past Medical History  Diagnosis Date  . Insomnia   . Ovarian cyst   . Fatty liver   . HTN (hypertension)   . Allergic rhinitis   . Hepatitis   . Anemia   . Cardiac arrhythmia due to congenital heart disease   . Depression   . Fibromyalgia   . Gallstones   . Obesity   . Sciatica    Past Surgical History  Procedure Laterality Date  . Laparoscopic cholecystectomy  03/17/00    with intraoperative cholangiogram.  . Exploratory laparotomy   07/15/2010      Lysis of adhesions, Bilateral salpingo-oophorectomy.   Marland Kitchen Appendectomy  1994  . Laparoscopic oophorectomy    . Colonoscopy  02/03/11    diminutive adenoma and hemorrhoids  . Upper gastrointestinal endoscopy  02/03/11    53 Fr Maloney dilation (looked normal but dysphagia)   Family History  Problem Relation Age of Onset  . Diabetes Mother     sister  . Hypertension Mother     sister  . Lung disease Mother   . Osteoporosis Mother     brother  . Basal cell carcinoma Mother   . Mental illness Mother   . Kidney failure  Brother   . Sleep apnea Sister     mother   History  Substance Use Topics  . Smoking status: Former Smoker -- 0.20 packs/day for 8 years    Types: Cigarettes    Quit date: 03/29/2010  . Smokeless tobacco: Never Used  . Alcohol Use: Yes     Comment: ocassionaly   OB History    No data available     Review of Systems  Constitutional: Negative for fever, chills and fatigue.  HENT: Negative for trouble swallowing.   Eyes: Negative for visual disturbance.  Respiratory: Negative for shortness of breath.   Cardiovascular: Negative for chest pain and palpitations.  Gastrointestinal: Negative for nausea, vomiting, abdominal pain and diarrhea.  Genitourinary: Negative for dysuria and difficulty urinating.  Musculoskeletal: Positive for back pain and arthralgias. Negative for neck pain.  Skin: Negative for color change.  Neurological: Negative for dizziness and weakness.  Psychiatric/Behavioral: Negative for dysphoric mood.      Allergies  Actifed cold-allergy; Ampicillin; Beef-derived products; Darvocet; Darvon compound; Indomethacin; Lorcet; Tofranil-pm; and Ultram  Home Medications   Prior to Admission medications   Medication Sig Start Date End Date Taking? Authorizing Provider  amphetamine-dextroamphetamine (ADDERALL) 5 MG tablet Take 5 mg by mouth daily.   Yes Historical Provider, MD  azelastine (ASTELIN) 137 MCG/SPRAY nasal spray Place 1 spray  into the nose 2 (two) times daily. Use in each nostril as directed   Yes Historical Provider, MD  b complex vitamins tablet Take 1 tablet by mouth. Takes only Oct - April   Yes Historical Provider, MD  Cholecalciferol (VITAMIN D) 2000 UNITS tablet Take 2,000 Units by mouth. Takes only Oct - April   Yes Historical Provider, MD  cyclobenzaprine (FLEXERIL) 10 MG tablet Take 10 mg by mouth as needed for muscle spasms.    Yes Historical Provider, MD  LORazepam (ATIVAN) 1 MG tablet Take 0.5 mg by mouth at bedtime as needed for anxiety.    Yes  Historical Provider, MD  losartan (COZAAR) 50 MG tablet Take 50 mg by mouth daily.     Yes Historical Provider, MD  omeprazole (PRILOSEC OTC) 20 MG tablet Take 20 mg by mouth daily.   Yes Historical Provider, MD  OVER THE COUNTER MEDICATION Take 1 tablet by mouth 2 (two) times daily.   Yes Historical Provider, MD  triamcinolone cream (KENALOG) 0.1 % Apply 1 application topically 2 (two) times daily.   Yes Historical Provider, MD   BP 123/67 mmHg  Pulse 88  Temp(Src) 97.8 F (36.6 C) (Oral)  Resp 18  SpO2 91% Physical Exam  Constitutional: She is oriented to person, place, and time. She appears well-developed and well-nourished. No distress.  HENT:  Head: Normocephalic and atraumatic.  Eyes: Conjunctivae and EOM are normal.  Neck: Normal range of motion.  Cardiovascular: Normal rate and regular rhythm.  Exam reveals no gallop and no friction rub.   No murmur heard. Pulmonary/Chest: Effort normal and breath sounds normal. She has no wheezes. She has no rales. She exhibits no tenderness.  Abdominal: Soft. She exhibits no distension. There is no tenderness. There is no rebound.  Musculoskeletal: Normal range of motion.  Midline lower lumbar tenderness to palpation and right gluteal tenderness to palpation.   Neurological: She is alert and oriented to person, place, and time. Coordination normal.  Lower extremity sensation to light touch equal and intact bilaterally. Speech is goal-oriented. Moves limbs without ataxia.   Skin: Skin is warm and dry.  Psychiatric: She has a normal mood and affect. Her behavior is normal.  Nursing note and vitals reviewed.   ED Course  Procedures (including critical care time) Labs Review Labs Reviewed - No data to display  Imaging Review Dg Lumbar Spine Complete  05/23/2014   CLINICAL DATA:  Sciatica for several months. A pulled muscle in the low back after bending over a few days ago. Pain since then. Radiating to both sides of the groin.  EXAM:  LUMBAR SPINE - COMPLETE 4+ VIEW  COMPARISON:  03/04/2014  FINDINGS: Lumbar spine is convex towards the right. This may be due to patient positioning although muscle spasm could also have this appearance. Normal alignment of the facet joints. Slight anterior subluxation of L4 on L5 is unchanged since prior study likely degenerative. Mild degenerative changes in the thoracic and lumbar spine. No vertebral compression deformities. No focal bone lesion or bone destruction.  IMPRESSION: Convexity lumbar spine towards the right may indicate muscle spasm or patient positioning. Mild degenerative changes. No displaced fractures identified.   Electronically Signed   By: Lucienne Capers M.D.   On: 05/23/2014 02:03   Dg Pelvis 1-2 Views  05/23/2014   CLINICAL DATA:  Back pain and sciatica.  EXAM: PELVIS - 1-2 VIEW  COMPARISON:  CT abdomen and pelvis 09/04/2009  FINDINGS: Pelvis and visualized hips appear intact. No  displaced fractures identified. Degenerative changes in the lower lumbar spine and in both hips. 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 SI joints and symphysis pubis are not displaced.  IMPRESSION: No acute bony abnormalities. Degenerative changes in the lower lumbar spine and hips. 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   Electronically Signed   By: Lucienne Capers M.D.   On: 05/23/2014 02:04     EKG Interpretation None      MDM   Final diagnoses:  Sciatica neuralgia, right    1:26 AM Imaging pending. Patient will have morphine and valium for pain. No neuro deficits. Vitals stable and patient afebrile.   Patient feeling better and will have Percocet and Valium RX. No acute changes on imaging. Patient instructed to follow up with PCP and return to the ED with worsening or concerning symptoms.   Alvina Chou, PA-C 05/23/14 E. Lopez, MD 05/24/14 [phone]

## 2014-05-23 NOTE — Discharge Instructions (Signed)
Take Percocet as needed for pain. Take Valium as needed for muscle spasm. Refer to attached documents for more information. Return to the ED with worsening or concerning symptoms.

## 2014-05-23 NOTE — ED Notes (Signed)
Patient received Fentanyl 150 mcg and Zofran 4 mg IV per EMS

## 2014-05-23 NOTE — ED Notes (Signed)
Pt stated she would like some pain medicine to go home with until she gets her medicine filled.

## 2014-05-23 NOTE — ED Notes (Signed)
Pt reports about 2100 last night, she started to have R groin pain and low back pain.  Pt reports hx of back pain with sciatica since November.  Pt reports pain in R low back, R groin that radiates down to her R leg.  Pt reports taking robaxin and percocet later  which is 64 years old without relief.  Reports receiving 236mcg of fentanyl en route which help tolerate the pain.  Pt is A&Ox 4.  Family at bedside.

## 2014-05-23 NOTE — ED Notes (Signed)
Patient arrives by EMS with complaints of lower back pain, right hip pain with radiation down right leg.  EMS states patient was diaphoretic with pain-tried taking Robaxin and Percocet with no relief prior to calling EMS

## 2014-05-23 NOTE — ED Notes (Signed)
Bed: WA17 Expected date:  Expected time:  Means of arrival:  Comments: EMS back pain

## 2014-05-23 NOTE — ED Notes (Signed)
Patient transported to X-ray 

## 2014-08-07 ENCOUNTER — Other Ambulatory Visit: Payer: Self-pay | Admitting: Neurosurgery

## 2014-08-13 ENCOUNTER — Encounter (HOSPITAL_COMMUNITY): Payer: Self-pay

## 2014-08-13 ENCOUNTER — Encounter (HOSPITAL_COMMUNITY)
Admission: RE | Admit: 2014-08-13 | Discharge: 2014-08-13 | Disposition: A | Discharge status: Home / Self Care | Payer: 59 | Source: Ambulatory Visit | Attending: Neurosurgery | Admitting: Neurosurgery

## 2014-08-13 DIAGNOSIS — K219 Gastro-esophageal reflux disease without esophagitis: Secondary | ICD-10-CM | POA: Diagnosis not present

## 2014-08-13 DIAGNOSIS — M5136 Other intervertebral disc degeneration, lumbar region: Secondary | ICD-10-CM | POA: Diagnosis not present

## 2014-08-13 DIAGNOSIS — M797 Fibromyalgia: Secondary | ICD-10-CM | POA: Diagnosis not present

## 2014-08-13 DIAGNOSIS — I10 Essential (primary) hypertension: Secondary | ICD-10-CM | POA: Diagnosis not present

## 2014-08-13 DIAGNOSIS — M4316 Spondylolisthesis, lumbar region: Secondary | ICD-10-CM | POA: Diagnosis not present

## 2014-08-13 DIAGNOSIS — Z87891 Personal history of nicotine dependence: Secondary | ICD-10-CM | POA: Diagnosis not present

## 2014-08-13 DIAGNOSIS — M4726 Other spondylosis with radiculopathy, lumbar region: Secondary | ICD-10-CM | POA: Diagnosis present

## 2014-08-13 HISTORY — DX: Other complications of anesthesia, initial encounter: T88.59XA

## 2014-08-13 HISTORY — DX: Personal history of colonic polyps: Z86.010

## 2014-08-13 HISTORY — DX: Anxiety disorder, unspecified: F41.9

## 2014-08-13 HISTORY — DX: Gastro-esophageal reflux disease without esophagitis: K21.9

## 2014-08-13 HISTORY — DX: Personal history of urinary (tract) infections: Z87.440

## 2014-08-13 HISTORY — DX: Dermatitis, unspecified: L30.9

## 2014-08-13 HISTORY — DX: Adverse effect of unspecified anesthetic, initial encounter: T41.45XA

## 2014-08-13 HISTORY — DX: Allergy status to unspecified drugs, medicaments and biological substances: Z88.9

## 2014-08-13 HISTORY — DX: Nausea with vomiting, unspecified: R11.2

## 2014-08-13 HISTORY — DX: Other specified postprocedural states: Z98.890

## 2014-08-13 LAB — BASIC METABOLIC PANEL
ANION GAP: 8 (ref 5–15)
BUN: 13 mg/dL (ref 6–20)
CHLORIDE: 108 mmol/L (ref 101–111)
CO2: 27 mmol/L (ref 22–32)
CREATININE: 0.65 mg/dL (ref 0.44–1.00)
Calcium: 9.8 mg/dL (ref 8.9–10.3)
GFR calc Af Amer: >60 mL/min (ref >60)
GFR calc non Af Amer: >60 mL/min (ref >60)
Glucose, Bld: 122 mg/dL — ABNORMAL HIGH (ref 65–99)
Potassium: 4.2 mmol/L (ref 3.5–5.1)
Sodium: 143 mmol/L (ref 135–145)

## 2014-08-13 LAB — SURGICAL PCR SCREEN
MRSA, PCR: NEGATIVE
Staphylococcus aureus: NEGATIVE

## 2014-08-13 LAB — CBC
HEMATOCRIT: 40.7 % (ref 36.0–46.0)
Hemoglobin: 14.2 g/dL (ref 12.0–15.0)
MCH: 32 pg (ref 26.0–34.0)
MCHC: 34.9 g/dL (ref 30.0–36.0)
MCV: 91.7 fL (ref 78.0–100.0)
PLATELETS: 183 10*3/uL (ref 150–400)
RBC: 4.44 MIL/uL (ref 3.87–5.11)
RDW: 12 % (ref 11.5–15.5)
WBC: 6.7 10*3/uL (ref 4.0–10.5)

## 2014-08-13 NOTE — Pre-Procedure Instructions (Signed)
Miranda Stark  08/13/2014   Your procedure is scheduled on:  Thursday, May 19  Report to [facility] Admitting at 0530 AM.  Call this number if you have problems the morning of surgery: [phone]   Remember:   Do not eat food or drink liquids after midnight.Wednesday night   Take these medicines the morning of surgery with A SIP OF WATER: Nexium, Oxycodone if needed for pain   Do not wear jewelry, make-up or nail polish.  Do not wear lotions, powders, or perfumes. You may wear deodorant.  Do not shave 48 hours prior to surgery. Men may shave face and neck.  Do not bring valuables to the hospital.  [facility] is not responsible    for any belongings or valuables.               Contacts, dentures or bridgework may not be worn into surgery.  Leave suitcase in the car. After surgery it may be brought to your room.  For patients admitted to the hospital, discharge time is determined by your   treatment team.                  Special Instructions:  - Preparing for Surgery  Before surgery, you can play an important role.  Because skin is not sterile, your skin needs to be as free of germs as possible.  You can reduce the number of germs on you skin by washing with CHG (chlorahexidine gluconate) soap before surgery.  CHG is an antiseptic cleaner which kills germs and bonds with the skin to continue killing germs even after washing.  Please DO NOT use if you have an allergy to CHG or antibacterial soaps.  If your skin becomes reddened/irritated stop using the CHG and inform your nurse when you arrive at Short Stay.  Do not shave (including legs and underarms) for at least 48 hours prior to the first CHG shower.  You may shave your face.  Please follow these instructions carefully:   1.  Shower with CHG Soap the night before surgery and the   morning of Surgery.  2.  If you choose to wash your hair, wash your hair first as usual with your  normal shampoo.  3.  After  you shampoo, rinse your hair and body thoroughly to remove the  Shampoo.  4.  Use CHG as you would any other liquid soap.  You can apply chg directly   to the skin and wash gently with scrungie or a clean washcloth.  5.  Apply the CHG Soap to your body ONLY FROM THE NECK DOWN.    Do not use on open wounds or open sores.  Avoid contact with your eyes,   ears, mouth and genitals (private parts).  Wash genitals (private parts)   with your normal soap.  6.  Wash thoroughly, paying special attention to the area where your surgery   will be performed.  7.  Thoroughly rinse your body with warm water from the neck down.  8.  DO NOT shower/wash with your normal soap after using and rinsing off   the CHG Soap.  9.  Pat yourself dry with a clean towel.            10.  Wear clean pajamas.            11.  Place clean sheets on your bed the night of your first shower and do not   sleep  with pets.  Day of Surgery  Do not apply any lotions/deoderants the morning of surgery.  Please wear clean clothes to the hospital/surgery center.     Please read over the following fact sheets that you were given: Pain Booklet, Coughing and Deep Breathing, MRSA Information and Surgical Site Infection Prevention

## 2014-08-14 MED ORDER — VANCOMYCIN HCL IN DEXTROSE 1-5 GM/200ML-% IV SOLN
1000.0000 mg | INTRAVENOUS | Status: AC
  Administered 2014-08-15: 1000 mg via INTRAVENOUS
  Filled 2014-08-14: qty 200

## 2014-08-15 ENCOUNTER — Observation Stay (HOSPITAL_COMMUNITY): Payer: 59

## 2014-08-15 ENCOUNTER — Observation Stay (HOSPITAL_COMMUNITY)
Admission: RE | Admit: 2014-08-15 | Discharge: 2014-08-16 | Disposition: A | Discharge status: Home / Self Care | Payer: 59 | Source: Ambulatory Visit | Attending: Neurosurgery | Admitting: Neurosurgery

## 2014-08-15 ENCOUNTER — Encounter (HOSPITAL_COMMUNITY): Payer: Self-pay | Admitting: *Deleted

## 2014-08-15 ENCOUNTER — Ambulatory Visit (HOSPITAL_COMMUNITY): Payer: 59 | Admitting: Anesthesiology

## 2014-08-15 ENCOUNTER — Encounter (HOSPITAL_COMMUNITY)
Admission: RE | Disposition: A | Discharge status: Home / Self Care | Payer: Self-pay | Source: Ambulatory Visit | Attending: Neurosurgery

## 2014-08-15 DIAGNOSIS — M797 Fibromyalgia: Secondary | ICD-10-CM | POA: Insufficient documentation

## 2014-08-15 DIAGNOSIS — M4316 Spondylolisthesis, lumbar region: Secondary | ICD-10-CM | POA: Diagnosis not present

## 2014-08-15 DIAGNOSIS — I10 Essential (primary) hypertension: Secondary | ICD-10-CM | POA: Insufficient documentation

## 2014-08-15 DIAGNOSIS — Z87891 Personal history of nicotine dependence: Secondary | ICD-10-CM | POA: Insufficient documentation

## 2014-08-15 DIAGNOSIS — K219 Gastro-esophageal reflux disease without esophagitis: Secondary | ICD-10-CM | POA: Insufficient documentation

## 2014-08-15 DIAGNOSIS — M5126 Other intervertebral disc displacement, lumbar region: Secondary | ICD-10-CM | POA: Diagnosis present

## 2014-08-15 DIAGNOSIS — Z419 Encounter for procedure for purposes other than remedying health state, unspecified: Secondary | ICD-10-CM

## 2014-08-15 DIAGNOSIS — M5136 Other intervertebral disc degeneration, lumbar region: Secondary | ICD-10-CM | POA: Insufficient documentation

## 2014-08-15 HISTORY — PX: LUMBAR LAMINECTOMY/DECOMPRESSION MICRODISCECTOMY: SHX5026

## 2014-08-15 SURGERY — LUMBAR LAMINECTOMY/DECOMPRESSION MICRODISCECTOMY 1 LEVEL
Anesthesia: General | Laterality: Right

## 2014-08-15 MED ORDER — KETOROLAC TROMETHAMINE 30 MG/ML IJ SOLN
INTRAMUSCULAR | Status: AC
  Filled 2014-08-15: qty 1

## 2014-08-15 MED ORDER — LOSARTAN POTASSIUM 50 MG PO TABS
50.0000 mg | ORAL_TABLET | Freq: Every day | ORAL | Status: DC
  Administered 2014-08-15: 50 mg via ORAL
  Filled 2014-08-15 (×2): qty 1

## 2014-08-15 MED ORDER — MENTHOL 3 MG MT LOZG: 1.0000 | LOZENGE | OROMUCOSAL | Status: DC | PRN

## 2014-08-15 MED ORDER — SODIUM CHLORIDE 0.9 % IR SOLN
Status: DC | PRN
  Administered 2014-08-15: 07:00:00

## 2014-08-15 MED ORDER — AMPHETAMINE-DEXTROAMPHETAMINE 10 MG PO TABS: 5.0000 mg | ORAL_TABLET | Freq: Every day | ORAL | Status: DC

## 2014-08-15 MED ORDER — ONDANSETRON HCL 4 MG/2ML IJ SOLN
INTRAMUSCULAR | Status: DC | PRN
  Administered 2014-08-15 (×2): 4 mg via INTRAVENOUS

## 2014-08-15 MED ORDER — HYDROMORPHONE HCL 1 MG/ML IJ SOLN
0.2500 mg | INTRAMUSCULAR | Status: DC | PRN
  Administered 2014-08-15 (×2): 0.25 mg via INTRAVENOUS

## 2014-08-15 MED ORDER — ONDANSETRON HCL 4 MG/2ML IJ SOLN: 4.0000 mg | Freq: Four times a day (QID) | INTRAMUSCULAR | Status: DC | PRN

## 2014-08-15 MED ORDER — KETOROLAC TROMETHAMINE 30 MG/ML IJ SOLN: 30.0000 mg | Freq: Once | INTRAMUSCULAR | Status: DC | PRN

## 2014-08-15 MED ORDER — LORAZEPAM 0.5 MG PO TABS: 1.0000 mg | ORAL_TABLET | Freq: Every evening | ORAL | Status: DC | PRN

## 2014-08-15 MED ORDER — HYDROXYZINE HCL 50 MG/ML IM SOLN
50.0000 mg | INTRAMUSCULAR | Status: DC | PRN
  Filled 2014-08-15: qty 1

## 2014-08-15 MED ORDER — FENTANYL CITRATE (PF) 100 MCG/2ML IJ SOLN
INTRAMUSCULAR | Status: DC | PRN
  Administered 2014-08-15: 100 ug via INTRAVENOUS

## 2014-08-15 MED ORDER — ROCURONIUM BROMIDE 50 MG/5ML IV SOLN
INTRAVENOUS | Status: AC
  Filled 2014-08-15: qty 1

## 2014-08-15 MED ORDER — SODIUM CHLORIDE 0.9 % IJ SOLN
3.0000 mL | Freq: Two times a day (BID) | INTRAMUSCULAR | Status: DC
  Administered 2014-08-15 (×2): 3 mL via INTRAVENOUS

## 2014-08-15 MED ORDER — CYCLOBENZAPRINE HCL 10 MG PO TABS: 10.0000 mg | ORAL_TABLET | Freq: Three times a day (TID) | ORAL | Status: DC | PRN

## 2014-08-15 MED ORDER — BISACODYL 10 MG RE SUPP: 10.0000 mg | Freq: Every day | RECTAL | Status: DC | PRN

## 2014-08-15 MED ORDER — NEOSTIGMINE METHYLSULFATE 10 MG/10ML IV SOLN
INTRAVENOUS | Status: DC | PRN
  Administered 2014-08-15: 3 mg via INTRAVENOUS

## 2014-08-15 MED ORDER — PHENOL 1.4 % MT LIQD
1.0000 | OROMUCOSAL | Status: DC | PRN
Start: 2014-08-15 — End: 2014-08-16

## 2014-08-15 MED ORDER — OXYCODONE-ACETAMINOPHEN 5-325 MG PO TABS
1.0000 | ORAL_TABLET | ORAL | Status: DC | PRN
  Administered 2014-08-15: 1 via ORAL
  Filled 2014-08-15: qty 1

## 2014-08-15 MED ORDER — LIDOCAINE HCL (CARDIAC) 20 MG/ML IV SOLN
INTRAVENOUS | Status: AC
  Filled 2014-08-15: qty 5

## 2014-08-15 MED ORDER — DEXAMETHASONE SODIUM PHOSPHATE 4 MG/ML IJ SOLN
INTRAMUSCULAR | Status: DC | PRN
  Administered 2014-08-15: 8 mg via INTRAVENOUS

## 2014-08-15 MED ORDER — DEXAMETHASONE SODIUM PHOSPHATE 4 MG/ML IJ SOLN
INTRAMUSCULAR | Status: AC
  Filled 2014-08-15: qty 2

## 2014-08-15 MED ORDER — ROCURONIUM BROMIDE 100 MG/10ML IV SOLN
INTRAVENOUS | Status: DC | PRN
  Administered 2014-08-15: 40 mg via INTRAVENOUS

## 2014-08-15 MED ORDER — KETOROLAC TROMETHAMINE 30 MG/ML IJ SOLN
30.0000 mg | Freq: Four times a day (QID) | INTRAMUSCULAR | Status: DC
  Administered 2014-08-15 – 2014-08-16 (×4): 30 mg via INTRAVENOUS
  Filled 2014-08-15 (×7): qty 1

## 2014-08-15 MED ORDER — ACETAMINOPHEN 650 MG RE SUPP: 650.0000 mg | RECTAL | Status: DC | PRN

## 2014-08-15 MED ORDER — LIDOCAINE HCL (CARDIAC) 20 MG/ML IV SOLN
INTRAVENOUS | Status: DC | PRN
  Administered 2014-08-15: 50 mg via INTRAVENOUS

## 2014-08-15 MED ORDER — PROMETHAZINE HCL 25 MG/ML IJ SOLN: 6.2500 mg | INTRAMUSCULAR | Status: DC | PRN

## 2014-08-15 MED ORDER — MIDAZOLAM HCL 5 MG/5ML IJ SOLN
INTRAMUSCULAR | Status: DC | PRN
  Administered 2014-08-15: 2 mg via INTRAVENOUS

## 2014-08-15 MED ORDER — KCL IN DEXTROSE-NACL 20-5-0.45 MEQ/L-%-% IV SOLN
INTRAVENOUS | Status: DC
  Filled 2014-08-15 (×4): qty 1000

## 2014-08-15 MED ORDER — MORPHINE SULFATE 4 MG/ML IJ SOLN: 4.0000 mg | INTRAMUSCULAR | Status: DC | PRN

## 2014-08-15 MED ORDER — LIDOCAINE-EPINEPHRINE 1 %-1:100000 IJ SOLN
INTRAMUSCULAR | Status: DC | PRN
  Administered 2014-08-15: 20 mL

## 2014-08-15 MED ORDER — MEPERIDINE HCL 25 MG/ML IJ SOLN: 6.2500 mg | INTRAMUSCULAR | Status: DC | PRN

## 2014-08-15 MED ORDER — AZELASTINE HCL 0.1 % NA SOLN
1.0000 | Freq: Two times a day (BID) | NASAL | Status: DC | PRN
  Filled 2014-08-15: qty 30

## 2014-08-15 MED ORDER — PROPOFOL 10 MG/ML IV BOLUS
INTRAVENOUS | Status: AC
  Filled 2014-08-15: qty 20

## 2014-08-15 MED ORDER — THROMBIN 5000 UNITS EX SOLR
CUTANEOUS | Status: DC | PRN
  Administered 2014-08-15: 5000 [IU] via TOPICAL

## 2014-08-15 MED ORDER — PHENYLEPHRINE HCL 10 MG/ML IJ SOLN
INTRAMUSCULAR | Status: DC | PRN
  Administered 2014-08-15 (×6): 80 ug via INTRAVENOUS

## 2014-08-15 MED ORDER — METHYLPREDNISOLONE ACETATE 40 MG/ML IJ SUSP
INTRAMUSCULAR | Status: DC | PRN
  Administered 2014-08-15 (×2): 40 mg

## 2014-08-15 MED ORDER — PROPOFOL 10 MG/ML IV BOLUS
INTRAVENOUS | Status: DC | PRN
  Administered 2014-08-15: 160 mg via INTRAVENOUS

## 2014-08-15 MED ORDER — ONDANSETRON HCL 4 MG/2ML IJ SOLN
INTRAMUSCULAR | Status: AC
  Filled 2014-08-15: qty 2

## 2014-08-15 MED ORDER — ARTIFICIAL TEARS OP OINT
TOPICAL_OINTMENT | OPHTHALMIC | Status: DC | PRN
  Administered 2014-08-15: 1 via OPHTHALMIC

## 2014-08-15 MED ORDER — ALUM & MAG HYDROXIDE-SIMETH 200-200-20 MG/5ML PO SUSP: 30.0000 mL | Freq: Four times a day (QID) | ORAL | Status: DC | PRN

## 2014-08-15 MED ORDER — HYDROCODONE-ACETAMINOPHEN 5-325 MG PO TABS: 1.0000 | ORAL_TABLET | ORAL | Status: DC | PRN

## 2014-08-15 MED ORDER — FENTANYL CITRATE (PF) 250 MCG/5ML IJ SOLN
INTRAMUSCULAR | Status: AC
  Filled 2014-08-15: qty 5

## 2014-08-15 MED ORDER — ACETAMINOPHEN 325 MG PO TABS: 650.0000 mg | ORAL_TABLET | ORAL | Status: DC | PRN

## 2014-08-15 MED ORDER — MIDAZOLAM HCL 2 MG/2ML IJ SOLN
INTRAMUSCULAR | Status: AC
  Filled 2014-08-15: qty 2

## 2014-08-15 MED ORDER — LACTATED RINGERS IV SOLN
INTRAVENOUS | Status: DC | PRN
  Administered 2014-08-15 (×2): via INTRAVENOUS

## 2014-08-15 MED ORDER — 0.9 % SODIUM CHLORIDE (POUR BTL) OPTIME
TOPICAL | Status: DC | PRN
  Administered 2014-08-15: 1000 mL

## 2014-08-15 MED ORDER — GENTAMICIN IN SALINE 1.6-0.9 MG/ML-% IV SOLN
80.0000 mg | INTRAVENOUS | Status: AC
  Administered 2014-08-15: 80 mg via INTRAVENOUS
  Filled 2014-08-15: qty 50

## 2014-08-15 MED ORDER — ONDANSETRON HCL 4 MG PO TABS: 4.0000 mg | ORAL_TABLET | Freq: Four times a day (QID) | ORAL | Status: DC | PRN

## 2014-08-15 MED ORDER — HYDROMORPHONE HCL 1 MG/ML IJ SOLN
INTRAMUSCULAR | Status: AC
  Filled 2014-08-15: qty 1

## 2014-08-15 MED ORDER — SODIUM CHLORIDE 0.9 % IJ SOLN: 3.0000 mL | INTRAMUSCULAR | Status: DC | PRN

## 2014-08-15 MED ORDER — FENTANYL CITRATE (PF) 100 MCG/2ML IJ SOLN
INTRAMUSCULAR | Status: AC
Start: 2014-08-15 — End: 2014-08-15
  Filled 2014-08-15: qty 2

## 2014-08-15 MED ORDER — HEMOSTATIC AGENTS (NO CHARGE) OPTIME
TOPICAL | Status: DC | PRN
  Administered 2014-08-15: 1 via TOPICAL

## 2014-08-15 MED ORDER — MAGNESIUM HYDROXIDE 400 MG/5ML PO SUSP: 30.0000 mL | Freq: Every day | ORAL | Status: DC | PRN

## 2014-08-15 MED ORDER — GLYCOPYRROLATE 0.2 MG/ML IJ SOLN
INTRAMUSCULAR | Status: DC | PRN
  Administered 2014-08-15: 0.4 mg via INTRAVENOUS

## 2014-08-15 MED ORDER — ACETAMINOPHEN 10 MG/ML IV SOLN
INTRAVENOUS | Status: AC
  Administered 2014-08-15: 1000 mg via INTRAVENOUS
  Filled 2014-08-15: qty 100

## 2014-08-15 MED ORDER — KETOROLAC TROMETHAMINE 30 MG/ML IJ SOLN
30.0000 mg | Freq: Once | INTRAMUSCULAR | Status: AC
  Administered 2014-08-15: 30 mg via INTRAVENOUS

## 2014-08-15 MED ORDER — BUPIVACAINE HCL (PF) 0.5 % IJ SOLN
INTRAMUSCULAR | Status: DC | PRN
  Administered 2014-08-15: 20 mL

## 2014-08-15 MED ORDER — HYDROXYZINE HCL 25 MG PO TABS: 50.0000 mg | ORAL_TABLET | ORAL | Status: DC | PRN

## 2014-08-15 SURGICAL SUPPLY — 64 items
ADH SKN CLS APL DERMABOND .7 (GAUZE/BANDAGES/DRESSINGS) ×1
APL SKNCLS STERI-STRIP NONHPOA (GAUZE/BANDAGES/DRESSINGS)
BAG DECANTER FOR FLEXI CONT (MISCELLANEOUS) ×2 IMPLANT
BENZOIN TINCTURE PRP APPL 2/3 (GAUZE/BANDAGES/DRESSINGS) IMPLANT
BLADE CLIPPER SURG (BLADE) IMPLANT
BRUSH SCRUB EZ PLAIN DRY (MISCELLANEOUS) ×2 IMPLANT
BUR ACORN 6.0 ACORN (BURR) ×1 IMPLANT
BUR ACRON 5.0MM COATED (BURR) IMPLANT
BUR MATCHSTICK NEURO 3.0 LAGG (BURR) ×2 IMPLANT
CANISTER SUCT 3000ML PPV (MISCELLANEOUS) ×2 IMPLANT
CONT SPEC 4OZ CLIKSEAL STRL BL (MISCELLANEOUS) ×1 IMPLANT
DERMABOND ADVANCED (GAUZE/BANDAGES/DRESSINGS) ×1
DERMABOND ADVANCED .7 DNX12 (GAUZE/BANDAGES/DRESSINGS) IMPLANT
DRAPE LAPAROTOMY 100X72X124 (DRAPES) ×2 IMPLANT
DRAPE MICROSCOPE LEICA (MISCELLANEOUS) ×2 IMPLANT
DRAPE POUCH INSTRU U-SHP 10X18 (DRAPES) ×2 IMPLANT
DRSG EMULSION OIL 3X3 NADH (GAUZE/BANDAGES/DRESSINGS) IMPLANT
ELECT REM PT RETURN 9FT ADLT (ELECTROSURGICAL) ×2
ELECTRODE REM PT RTRN 9FT ADLT (ELECTROSURGICAL) ×1 IMPLANT
GAUZE SPONGE 4X4 12PLY STRL (GAUZE/BANDAGES/DRESSINGS) IMPLANT
GAUZE SPONGE 4X4 16PLY XRAY LF (GAUZE/BANDAGES/DRESSINGS) IMPLANT
GLOVE BIOGEL PI IND STRL 8 (GLOVE) ×1 IMPLANT
GLOVE BIOGEL PI INDICATOR 8 (GLOVE) ×1
GLOVE ECLIPSE 7.5 STRL STRAW (GLOVE) ×2 IMPLANT
GLOVE EXAM NITRILE LRG STRL (GLOVE) IMPLANT
GLOVE EXAM NITRILE MD LF STRL (GLOVE) IMPLANT
GLOVE EXAM NITRILE XL STR (GLOVE) IMPLANT
GLOVE EXAM NITRILE XS STR PU (GLOVE) IMPLANT
GLOVE INDICATOR 6.5 STRL GRN (GLOVE) ×1 IMPLANT
GLOVE INDICATOR 7.0 STRL GRN (GLOVE) ×1 IMPLANT
GLOVE SURG SS PI 7.0 STRL IVOR (GLOVE) ×1 IMPLANT
GLOVE SURG SS PI 7.5 STRL IVOR (GLOVE) ×2 IMPLANT
GOWN STRL REUS W/ TWL LRG LVL3 (GOWN DISPOSABLE) ×1 IMPLANT
GOWN STRL REUS W/ TWL XL LVL3 (GOWN DISPOSABLE) IMPLANT
GOWN STRL REUS W/TWL 2XL LVL3 (GOWN DISPOSABLE) IMPLANT
GOWN STRL REUS W/TWL LRG LVL3 (GOWN DISPOSABLE) ×2
GOWN STRL REUS W/TWL XL LVL3 (GOWN DISPOSABLE)
KIT BASIN OR (CUSTOM PROCEDURE TRAY) ×2 IMPLANT
KIT ROOM TURNOVER OR (KITS) ×2 IMPLANT
NDL HYPO 18GX1.5 BLUNT FILL (NEEDLE) IMPLANT
NDL SPNL 18GX3.5 QUINCKE PK (NEEDLE) ×1 IMPLANT
NDL SPNL 22GX3.5 QUINCKE BK (NEEDLE) ×1 IMPLANT
NEEDLE HYPO 18GX1.5 BLUNT FILL (NEEDLE) ×2 IMPLANT
NEEDLE SPNL 18GX3.5 QUINCKE PK (NEEDLE) ×2 IMPLANT
NEEDLE SPNL 22GX3.5 QUINCKE BK (NEEDLE) ×2 IMPLANT
NS IRRIG 1000ML POUR BTL (IV SOLUTION) ×2 IMPLANT
PACK LAMINECTOMY NEURO (CUSTOM PROCEDURE TRAY) ×2 IMPLANT
PAD ARMBOARD 7.5X6 YLW CONV (MISCELLANEOUS) ×8 IMPLANT
PATTIES SURGICAL .5 X.5 (GAUZE/BANDAGES/DRESSINGS) ×1 IMPLANT
PATTIES SURGICAL .5 X1 (DISPOSABLE) ×2 IMPLANT
RUBBERBAND STERILE (MISCELLANEOUS) ×4 IMPLANT
SPONGE LAP 4X18 X RAY DECT (DISPOSABLE) IMPLANT
SPONGE SURGIFOAM ABS GEL SZ50 (HEMOSTASIS) ×2 IMPLANT
STRIP CLOSURE SKIN 1/2X4 (GAUZE/BANDAGES/DRESSINGS) IMPLANT
SUT PROLENE 6 0 BV (SUTURE) IMPLANT
SUT VIC AB 1 CT1 18XBRD ANBCTR (SUTURE) ×1 IMPLANT
SUT VIC AB 1 CT1 8-18 (SUTURE) ×4
SUT VIC AB 2-0 CP2 18 (SUTURE) ×3 IMPLANT
SUT VIC AB 3-0 SH 8-18 (SUTURE) ×1 IMPLANT
SYR 20ML ECCENTRIC (SYRINGE) ×2 IMPLANT
SYR 5ML LL (SYRINGE) ×3 IMPLANT
TOWEL OR 17X24 6PK STRL BLUE (TOWEL DISPOSABLE) ×2 IMPLANT
TOWEL OR 17X26 10 PK STRL BLUE (TOWEL DISPOSABLE) ×2 IMPLANT
WATER STERILE IRR 1000ML POUR (IV SOLUTION) ×2 IMPLANT

## 2014-08-15 NOTE — Anesthesia Postprocedure Evaluation (Signed)
Anesthesia Post Note  Patient: Miranda Stark  Procedure(s) Performed: Procedure(s) (LRB): LUMBAR LAMINECTOMY/DECOMPRESSION MICRODISCECTOMY RIGHT LUMBAR FOUR-FIVE AND ,EXTRAFORAMINAL EXPLORATION (Right)  Anesthesia type: General  Patient location: PACU  Post pain: Pain level controlled  Post assessment: Post-op Vital signs reviewed  Last Vitals: BP 126/79 mmHg  Pulse 79  Temp(Src) 36.6 C (Oral)  Resp 18  Ht 5\' 4"  (1.626 m)  Wt 182 lb 6 oz (82.725 kg)  BMI 31.29 kg/m2  SpO2 96%  Post vital signs: Reviewed  Level of consciousness: sedated  Complications: No apparent anesthesia complications

## 2014-08-15 NOTE — Anesthesia Preprocedure Evaluation (Signed)
Anesthesia Evaluation  Patient identified by MRN, date of birth, ID band Patient awake    Reviewed: Allergy & Precautions, NPO status , Patient's Chart, lab work & pertinent test results  History of Anesthesia Complications (+) PONV and history of anesthetic complications  Airway Mallampati: II  TM Distance: >3 FB Neck ROM: Full    Dental no notable dental hx.    Pulmonary COPDformer smoker,  breath sounds clear to auscultation  Pulmonary exam normal       Cardiovascular hypertension, Pt. on medications Normal cardiovascular examRhythm:Regular Rate:Normal     Neuro/Psych PSYCHIATRIC DISORDERS Anxiety Depression negative neurological ROS     GI/Hepatic GERD-  ,(+) Hepatitis -  Endo/Other  negative endocrine ROS  Renal/GU negative Renal ROS     Musculoskeletal  (+) Fibromyalgia -  Abdominal (+) + obese,   Peds  Hematology negative hematology ROS (+)   Anesthesia Other Findings   Reproductive/Obstetrics negative OB ROS                             Anesthesia Physical Anesthesia Plan  ASA: III  Anesthesia Plan: General   Post-op Pain Management:    Induction: Intravenous  Airway Management Planned: Oral ETT  Additional Equipment: None  Intra-op Plan:   Post-operative Plan: Extubation in OR  Informed Consent: I have reviewed the patients History and Physical, chart, labs and discussed the procedure including the risks, benefits and alternatives for the proposed anesthesia with the patient or authorized representative who has indicated his/her understanding and acceptance.   Dental advisory given  Plan Discussed with: CRNA  Anesthesia Plan Comments:         Anesthesia Quick Evaluation

## 2014-08-15 NOTE — H&P (Signed)
Subjective: Patient is a 64 y.o. right-handed white female who is admitted for treatment of right L4-5 lumbar disc herniation primarily foraminal and extraforaminal, superimposed upon relative L4-5 canal and bilateral lateral recess stenosis, right worse than left, with a mild grade 1 dynamic degenerative spondylolisthesis at L4-5, with resulting right lumbar radiculopathy.  Patient has had symptoms for the past 3 months, that of persisted despite nonsurgical management, the patient is now admitted for a right L4-5 lumbar laminotomy, a right L4-5 extraforaminal exploration, and microdiscectomy.    Patient Active Problem List   Diagnosis Date Noted  . Nonallergic rhinitis 11/16/2012  . Bronchitis, chronic obstructive, with exacerbation 11/16/2012  . Personal history of adenomatous colonic polyps 02/10/2011  . GERD (gastroesophageal reflux disease) 01/01/2011   Past Medical History  Diagnosis Date  . Insomnia   . Ovarian cyst   . Fatty liver   . HTN (hypertension)   . Allergic rhinitis   . Cardiac arrhythmia due to congenital heart disease   . Fibromyalgia   . Gallstones   . Obesity   . Sciatica   . Complication of anesthesia   . PONV (postoperative nausea and vomiting)   . H/O seasonal allergies   . Depression     history of  depression while caring for ill mother  . Anxiety     takes lorazepam for  prn anxiety  . History of recurrent UTI (urinary tract infection)     due to urethral stricture;; s/p dilatation 25 years ago  . GERD (gastroesophageal reflux disease)   . H/O colonoscopy with polypectomy   . Anemia     prior to  hysterectomy  . Hepatitis     drug induced elevated liver enzymes  . Eczema     Past Surgical History  Procedure Laterality Date  . Laparoscopic cholecystectomy  03/17/00    with intraoperative cholangiogram.  . Exploratory laparotomy   07/15/2010      Lysis of adhesions, Bilateral salpingo-oophorectomy.   Marland Kitchen Appendectomy  1994  . Laparoscopic  oophorectomy    . Colonoscopy  02/03/11    diminutive adenoma and hemorrhoids  . Upper gastrointestinal endoscopy  02/03/11    54 Fr Maloney dilation (looked normal but dysphagia)    Prescriptions prior to admission  Medication Sig Dispense Refill Last Dose  . amphetamine-dextroamphetamine (ADDERALL) 5 MG tablet Take 5 mg by mouth daily.   Past Week at Unknown time  . azelastine (ASTELIN) 0.1 % nasal spray Place 1 spray into both nostrils 2 (two) times daily as needed for rhinitis or allergies.   Past Month at Unknown time  . b complex vitamins tablet Take 1 tablet by mouth See admin instructions. Take 1 tablet daily October through April   Past Month at Unknown time  . Cholecalciferol (VITAMIN D) 2000 UNITS tablet Take 2,000 Units by mouth See admin instructions. Take 1 tablet daily October through April   Past Month at Unknown time  . cyclobenzaprine (FLEXERIL) 10 MG tablet Take 10 mg by mouth at bedtime as needed (fibromyalgia).    Past Month at Unknown time  . diclofenac (VOLTAREN) 75 MG EC tablet Take 75 mg by mouth 2 (two) times daily.   Past Week at Unknown time  . Esomeprazole Magnesium (NEXIUM PO) Take 1 capsule by mouth daily as needed (acid reflux).   08/15/2014 at 0445  . LORazepam (ATIVAN) 1 MG tablet Take 1 mg by mouth at bedtime as needed for sleep.    08/14/2014 at Unknown time  .  losartan (COZAAR) 100 MG tablet Take 50 mg by mouth daily after supper.   6 08/14/2014 at Unknown time  . oxyCODONE-acetaminophen (PERCOCET/ROXICET) 5-325 MG per tablet Take 2 tablets by mouth every 4 (four) hours as needed for severe pain. (Patient taking differently: Take 0.5 tablets by mouth every 4 (four) hours as needed for severe pain. ) 12 tablet 0 Past Week at Unknown time  . diazepam (VALIUM) 5 MG tablet Take 2 tablets (10 mg total) by mouth 2 (two) times daily. (Patient not taking: Reported on 08/09/2014) 10 tablet 0 Not Taking at Unknown time  . loratadine (CLARITIN) 10 MG tablet Take 10 mg by mouth  daily as needed for allergies.   More than a month at Unknown time  . triamcinolone cream (KENALOG) 0.1 % Apply 1 application topically 2 (two) times daily as needed (eczema).    More than a month at Unknown time   Allergies  Allergen Reactions  . Actifed Cold-Allergy [Chlorpheniramine-Phenyleph Er] Other (See Comments)    tachycardia  . Ampicillin Other (See Comments)    Causes yeast  . Beef-Derived Products Other (See Comments)    Mood swings  . Benadryl [Diphenhydramine] Other (See Comments)    Restless legs  . Darvocet [Propoxyphene N-Acetaminophen] Other (See Comments)    Floating sensation  . Darvon Compound Other (See Comments)    Floating sensation  . Indomethacin Other (See Comments)    Diarrhea, burning sensation lower abd  . Tofranil-Pm Other (See Comments)    Drug educed hepatitis  . Ultram [Tramadol Hcl] Other (See Comments)    Severe syncope    History  Substance Use Topics  . Smoking status: Former Smoker -- 0.20 packs/day for 8 years    Types: Cigarettes    Quit date: 03/29/2010  . Smokeless tobacco: Never Used  . Alcohol Use: Yes     Comment: ocassionaly    Family History  Problem Relation Age of Onset  . Diabetes Mother     sister  . Hypertension Mother     sister  . Lung disease Mother   . Osteoporosis Mother     brother  . Basal cell carcinoma Mother   . Mental illness Mother   . Kidney failure Brother   . Sleep apnea Sister     mother     Review of Systems A comprehensive review of systems was negative.  Objective: Vital signs in last 24 hours: Temp:  [97.5 F (36.4 C)] 97.5 F (36.4 C) (05/19 0601) Pulse Rate:  [86] 86 (05/19 0601) Resp:  [18] 18 (05/19 0601) BP: (128)/(80) 128/80 mmHg (05/19 0601) SpO2:  [96 %] 96 % (05/19 0601) Weight:  [82.725 kg (182 lb 6 oz)] 82.725 kg (182 lb 6 oz) (05/19 0601)  EXAM: Patient well-developed well-nourished white female in no acute distress. Lungs are clear to auscultation , the patient has  symmetrical respiratory excursion. Heart has a regular rate and rhythm normal S1 and S2 no murmur.   Abdomen is soft nontender nondistended bowel sounds are present. Extremity examination shows no clubbing cyanosis or edema. Motor examination shows 5 over 5 strength in the lower extremities including the iliopsoas quadriceps dorsiflexor extensor hallicus  longus and plantar flexor bilaterally. Sensation is intact to pinprick in the distal lower extremities. Reflexes are symmetrical bilaterally. No pathologic reflexes are present. Patient has a normal gait and stance.   Data Review:CBC    Component Value Date/Time   WBC 6.7 08/13/2014 1507   RBC 4.44 08/13/2014 1507  HGB 14.2 08/13/2014 1507   HCT 40.7 08/13/2014 1507   PLT 183 08/13/2014 1507   MCV 91.7 08/13/2014 1507   MCH 32.0 08/13/2014 1507   MCHC 34.9 08/13/2014 1507   RDW 12.0 08/13/2014 1507                          BMET    Component Value Date/Time   NA 143 08/13/2014 1507   K 4.2 08/13/2014 1507   CL 108 08/13/2014 1507   CO2 27 08/13/2014 1507   GLUCOSE 122* 08/13/2014 1507   BUN 13 08/13/2014 1507   CREATININE 0.65 08/13/2014 1507   CALCIUM 9.8 08/13/2014 1507   GFRNONAA >60 08/13/2014 1507   GFRAA >60 08/13/2014 1507     Assessment/Plan: Patient with right lumbar radiculopathy secondary to a right L4-5 lumbar disc herniation, superimposed upon significant degenerative changes. Patient admitted for right L4-5 lumbar laminotomy, right L4-5 extra foraminal exploration, and micro-discectomy.  I've discussed with the patient the nature of his condition, the nature the surgical procedure, the typical length of surgery, hospital stay, and overall recuperation. We discussed limitations postoperatively. I discussed risks of surgery including risks of infection, bleeding, possibly need for transfusion, the risk of nerve root dysfunction with pain, weakness, numbness, or paresthesias, or risk of dural tear and CSF leakage and  possible need for further surgery, the risk of recurrent disc herniation and the possible need for further surgery, and the risk of anesthetic complications including myocardial infarction, stroke, pneumonia, and death. Understanding all this the patient does wish to proceed with surgery and is admitted for such.    Hosie Spangle, MD 08/15/2014 7:14 AM

## 2014-08-15 NOTE — Plan of Care (Signed)
Problem: Consults Goal: Diagnosis - Spinal Surgery Outcome: Completed/Met Date Met:  08/15/14 Lumbar Laminectomy (Complex)

## 2014-08-15 NOTE — Transfer of Care (Signed)
Immediate Anesthesia Transfer of Care Note  Patient: Miranda Stark  Procedure(s) Performed: Procedure(s): LUMBAR LAMINECTOMY/DECOMPRESSION MICRODISCECTOMY RIGHT LUMBAR FOUR-FIVE AND ,EXTRAFORAMINAL EXPLORATION (Right)  Patient Location: PACU  Anesthesia Type:General  Level of Consciousness: awake, alert , oriented and patient cooperative  Airway & Oxygen Therapy: Patient Spontanous Breathing and Patient connected to nasal cannula oxygen  Post-op Assessment: Report given to RN and Post -op Vital signs reviewed and stable  Post vital signs: Reviewed and stable  Last Vitals:  Filed Vitals:   08/15/14 0601  BP: 128/80  Pulse: 86  Temp: 36.4 C  Resp: 18    Complications: No apparent anesthesia complications

## 2014-08-15 NOTE — Progress Notes (Signed)
Filed Vitals:   08/15/14 1049 08/15/14 1050 08/15/14 1111 08/15/14 1629  BP: 112/65  126/79 121/75  Pulse: 70  79 75  Temp:  98 F (36.7 C) 97.9 F (36.6 C) 98 F (36.7 C)  TempSrc:      Resp: 24  18 18   Height:      Weight:      SpO2: 97%  96% 98%    CBC  Recent Labs  08/13/14 1507  WBC 6.7  HGB 14.2  HCT 40.7  PLT 183   BMET  Recent Labs  08/13/14 1507  NA 143  K 4.2  CL 108  CO2 27  GLUCOSE 122*  BUN 13  CREATININE 0.65  CALCIUM 9.8    Patient resting in bed, has been up and ambulating 3 times so far in the hall. Wound clean and dry, Dermabond is intact. Patient voiding well. Moving all extremities well. Radicular symptoms improved.  Plan: Continued to progress through postoperative recovery.  Hosie Spangle, MD 08/15/2014, 6:12 PM

## 2014-08-15 NOTE — Op Note (Signed)
08/15/2014  9:41 AM  PATIENT:  Miranda Stark  64 y.o. female  PRE-OPERATIVE DIAGNOSIS:  Right L4-5 foraminal and extra foraminal disc herniation, lumbar degenerative disease, lumbar spondylosis, grade 1 dynamic degenerative spondylolisthesis, lumbar radiculopathy, lumbar stenosis  POST-OPERATIVE DIAGNOSIS: Right L4-5 foraminal and extra foraminal disc herniation, lumbar degenerative disease, lumbar spondylosis, grade 1 dynamic degenerative spondylolisthesis, lumbar radiculopathy, lumbar stenosis  PROCEDURE:  Procedure(s):  Right L4-5 lumbar laminotomy, right L4-5 extra foraminal exploration, micro-discectomy, with micro-section, microsurgical technique, and the operating microscope  SURGEON:  Surgeon(s): Jovita Gamma, MD  ANESTHESIA:   general  EBL:  Total I/O In: 1000 [I.V.:1000] Out: 30 [Blood:30]  COUNT: Correct per nursing staff  DICTATION: Patient was brought the operating room, placed under general endotracheal anesthesia. Patient was turned to a prone position. The lumbar region was prepped with Betadine soap and solution and draped in a sterile fashion. The midline was infiltrated with local site with epinephrine. An x-ray was taken and the L4-5 level identified. Midline incision is made over the L4-5 level and carried down through the subcutaneous tissue. Bipolar cautery and electrocautery were used to maintain hemostasis. Dissection was carried down to the lumbar fascia which was incised on the right side of the midline, and the paraspinal musculature was dissected from the spinous process and lamina in a sub-periosteal fashion. Additional x-rays were taken and the L4-5 intralaminar space was identified. Dissection was carried out laterally over the facet complex and pars interarticularis, into the extraforaminal space. The operating microscope was draped, and brought in the field to provide additional medication, illumination, and visualization, and the remainder the decompression  was performed using microdissection microsurgical technique.  A right L4-5 laminotomy performed using the high-speed drill and Kerrison punches. The thecal sac and exiting right L5 nerve root was identified and decompressed. The annulus of the L4-5 disc was identified, he was mildly bulging but no distinct disc herniation within the canal was noted. We then explored the right L4-5 extra foraminal space. The intertransverse muscle was removed, and the exiting right L4 nerve root was exposed. We then dissected inferomedially, and identified the annulus of the L4-5 disc extra foraminal. A subligamentous disc which was identified. The annulus was incised and a discectomy performed. The herniated disc was removed as was adjacent degenerated disc material. We were able to progressively decompressing the neural foramen, and exiting right L4 nerve root. In the end all loose fragments of disc material removed, and good decompression of the neural foramen and exiting right L4 nerve root was achieved. Hemostasis established with the use of bipolar cautery and Gelfoam with thrombin. The Gelfoam was removed, the wound irrigated extensively with bacitracin solution, hemostasis confirmed. We then instilled 2 mL of fentanyl and 80 mg of Depo-Medrol into the epidural space and extraforaminal space.  The wound was closed in multiple layers. The deep fascia was closed with interrupted undyed 1 Vicryl sutures. Scarpa's fascia was closed with interrupted undyed 1 Vicryl sutures. The subcutaneous and subcuticular closed interrupted inverted 2-0 Vicryl sutures. Skin is approximate Dermabond. Following surgery the patient was turned back to supine position, to be reversed and the anesthetic, x-ray, and transferred to the recovery room for further care.  PLAN OF CARE: Admit for overnight observation  PATIENT DISPOSITION:  PACU - hemodynamically stable.   Delay start of Pharmacological VTE agent (>24hrs) due to surgical blood loss  or risk of bleeding:  yes

## 2014-08-16 DIAGNOSIS — M4316 Spondylolisthesis, lumbar region: Secondary | ICD-10-CM | POA: Diagnosis not present

## 2014-08-16 MED ORDER — HYDROCODONE-ACETAMINOPHEN 5-325 MG PO TABS: 1.0000 | ORAL_TABLET | ORAL | Status: AC | PRN

## 2014-08-16 NOTE — Discharge Summary (Signed)
Physician Discharge Summary  Patient ID: Miranda Stark MRN: [id number] DOB/AGE: [birth year] 65 y.o.  Admit date: 08/15/2014 Discharge date: 08/16/2014  Admission Diagnoses:  Right L4-5 foraminal and extra foraminal disc herniation, lumbar degenerative disease, lumbar spondylosis, grade 1 dynamic degenerative spondylolisthesis, lumbar radiculopathy, lumbar stenosis  Discharge Diagnoses: Right L4-5 foraminal and extra foraminal disc herniation, lumbar degenerative disease, lumbar spondylosis, grade 1 dynamic degenerative spondylolisthesis, lumbar radiculopathy, lumbar stenosis  Active Problems:   HNP (herniated nucleus pulposus), lumbar   Discharged Condition: good  Hospital Course: She was admitted, underwent a right L4-5 lumbar laminotomy, right L4-5 extremity exploration, and microdiscectomy. She is done well following surgery with good relief of her radicular symptoms. She is up and living actively in the halls. Her incision is healing nicely. She has been given instructions regarding wound care and activities following discharge. She is to return for follow-up with me in 3 weeks.  Discharge Exam: Blood pressure 104/59, pulse 70, temperature 98.1 F (36.7 C), temperature source Oral, resp. rate 16, height 5\' 4"  (1.626 m), weight 82.725 kg (182 lb 6 oz), SpO2 96 %.  Disposition:  home      Medication List    STOP taking these medications        diazepam 5 MG tablet  Commonly known as:  VALIUM      TAKE these medications        amphetamine-dextroamphetamine 5 MG tablet  Commonly known as:  ADDERALL  Take 5 mg by mouth daily.     azelastine 0.1 % nasal spray  Commonly known as:  ASTELIN  Place 1 spray into both nostrils 2 (two) times daily as needed for rhinitis or allergies.     b complex vitamins tablet  Take 1 tablet by mouth See admin instructions. Take 1 tablet daily October through April     cyclobenzaprine 10 MG tablet  Commonly known as:  FLEXERIL  Take 10 mg by  mouth at bedtime as needed (fibromyalgia).     diclofenac 75 MG EC tablet  Commonly known as:  VOLTAREN  Take 75 mg by mouth 2 (two) times daily.     HYDROcodone-acetaminophen 5-325 MG per tablet  Commonly known as:  NORCO/VICODIN  Take 1-2 tablets by mouth every 4 (four) hours as needed (mild pain).     loratadine 10 MG tablet  Commonly known as:  CLARITIN  Take 10 mg by mouth daily as needed for allergies.     LORazepam 1 MG tablet  Commonly known as:  ATIVAN  Take 1 mg by mouth at bedtime as needed for sleep.     losartan 100 MG tablet  Commonly known as:  COZAAR  Take 50 mg by mouth daily after supper.     NEXIUM PO  Take 1 capsule by mouth daily as needed (acid reflux).     oxyCODONE-acetaminophen 5-325 MG per tablet  Commonly known as:  PERCOCET/ROXICET  Take 2 tablets by mouth every 4 (four) hours as needed for severe pain.     triamcinolone cream 0.1 %  Commonly known as:  KENALOG  Apply 1 application topically 2 (two) times daily as needed (eczema).     Vitamin D 2000 UNITS tablet  Take 2,000 Units by mouth See admin instructions. Take 1 tablet daily October through April         Signed: Hosie Spangle, MD 08/16/2014, 7:12 AM

## 2014-08-16 NOTE — Progress Notes (Signed)
Patient alert and oriented, mae's well, voiding adequate amount of urine, swallowing without difficulty, no c/o pain. Patient discharged home with family. Script and discharged instructions given to patient. Patient and family stated understanding of d/c instructions given and has an appointment with MD. 

## 2014-08-16 NOTE — Discharge Instructions (Signed)

## 2014-08-20 ENCOUNTER — Encounter (HOSPITAL_COMMUNITY): Payer: Self-pay | Admitting: Neurosurgery

## 2015-01-07 DIAGNOSIS — Z23 Encounter for immunization: Secondary | ICD-10-CM | POA: Diagnosis not present

## 2015-01-07 DIAGNOSIS — M545 Low back pain: Secondary | ICD-10-CM | POA: Diagnosis not present

## 2015-01-07 DIAGNOSIS — R739 Hyperglycemia, unspecified: Secondary | ICD-10-CM | POA: Diagnosis not present

## 2015-01-07 DIAGNOSIS — E7800 Pure hypercholesterolemia, unspecified: Secondary | ICD-10-CM | POA: Diagnosis not present

## 2015-01-07 DIAGNOSIS — I10 Essential (primary) hypertension: Secondary | ICD-10-CM | POA: Diagnosis not present

## 2015-01-28 DIAGNOSIS — M545 Low back pain: Secondary | ICD-10-CM | POA: Diagnosis not present

## 2015-01-28 DIAGNOSIS — M79674 Pain in right toe(s): Secondary | ICD-10-CM | POA: Diagnosis not present

## 2015-01-28 DIAGNOSIS — M6281 Muscle weakness (generalized): Secondary | ICD-10-CM | POA: Diagnosis not present

## 2015-02-03 DIAGNOSIS — M6281 Muscle weakness (generalized): Secondary | ICD-10-CM | POA: Diagnosis not present

## 2015-02-03 DIAGNOSIS — M79674 Pain in right toe(s): Secondary | ICD-10-CM | POA: Diagnosis not present

## 2015-02-03 DIAGNOSIS — M545 Low back pain: Secondary | ICD-10-CM | POA: Diagnosis not present

## 2015-02-06 DIAGNOSIS — M79674 Pain in right toe(s): Secondary | ICD-10-CM | POA: Diagnosis not present

## 2015-02-06 DIAGNOSIS — M545 Low back pain: Secondary | ICD-10-CM | POA: Diagnosis not present

## 2015-02-06 DIAGNOSIS — M6281 Muscle weakness (generalized): Secondary | ICD-10-CM | POA: Diagnosis not present

## 2015-02-07 ENCOUNTER — Other Ambulatory Visit: Payer: Self-pay | Admitting: Internal Medicine

## 2015-02-07 DIAGNOSIS — R740 Nonspecific elevation of levels of transaminase and lactic acid dehydrogenase [LDH]: Principal | ICD-10-CM

## 2015-02-07 DIAGNOSIS — R7402 Elevation of levels of lactic acid dehydrogenase (LDH): Secondary | ICD-10-CM

## 2015-02-10 DIAGNOSIS — M6281 Muscle weakness (generalized): Secondary | ICD-10-CM | POA: Diagnosis not present

## 2015-02-10 DIAGNOSIS — M79674 Pain in right toe(s): Secondary | ICD-10-CM | POA: Diagnosis not present

## 2015-02-10 DIAGNOSIS — M545 Low back pain: Secondary | ICD-10-CM | POA: Diagnosis not present

## 2015-02-13 DIAGNOSIS — M79674 Pain in right toe(s): Secondary | ICD-10-CM | POA: Diagnosis not present

## 2015-02-13 DIAGNOSIS — M6281 Muscle weakness (generalized): Secondary | ICD-10-CM | POA: Diagnosis not present

## 2015-02-13 DIAGNOSIS — M545 Low back pain: Secondary | ICD-10-CM | POA: Diagnosis not present

## 2015-02-14 DIAGNOSIS — M4726 Other spondylosis with radiculopathy, lumbar region: Secondary | ICD-10-CM | POA: Diagnosis not present

## 2015-02-14 DIAGNOSIS — M4316 Spondylolisthesis, lumbar region: Secondary | ICD-10-CM | POA: Diagnosis not present

## 2015-02-14 DIAGNOSIS — M5416 Radiculopathy, lumbar region: Secondary | ICD-10-CM | POA: Diagnosis not present

## 2015-02-14 DIAGNOSIS — M5136 Other intervertebral disc degeneration, lumbar region: Secondary | ICD-10-CM | POA: Diagnosis not present

## 2015-02-14 DIAGNOSIS — Z6832 Body mass index (BMI) 32.0-32.9, adult: Secondary | ICD-10-CM | POA: Diagnosis not present

## 2015-02-17 DIAGNOSIS — M545 Low back pain: Secondary | ICD-10-CM | POA: Diagnosis not present

## 2015-02-17 DIAGNOSIS — M6281 Muscle weakness (generalized): Secondary | ICD-10-CM | POA: Diagnosis not present

## 2015-02-17 DIAGNOSIS — M79674 Pain in right toe(s): Secondary | ICD-10-CM | POA: Diagnosis not present

## 2015-02-25 DIAGNOSIS — R5383 Other fatigue: Secondary | ICD-10-CM | POA: Diagnosis not present

## 2015-02-25 DIAGNOSIS — I10 Essential (primary) hypertension: Secondary | ICD-10-CM | POA: Diagnosis not present

## 2015-03-05 DIAGNOSIS — M79674 Pain in right toe(s): Secondary | ICD-10-CM | POA: Diagnosis not present

## 2015-03-05 DIAGNOSIS — M545 Low back pain: Secondary | ICD-10-CM | POA: Diagnosis not present

## 2015-03-05 DIAGNOSIS — M6281 Muscle weakness (generalized): Secondary | ICD-10-CM | POA: Diagnosis not present

## 2015-03-07 DIAGNOSIS — J301 Allergic rhinitis due to pollen: Secondary | ICD-10-CM | POA: Diagnosis not present

## 2015-03-19 DIAGNOSIS — M79674 Pain in right toe(s): Secondary | ICD-10-CM | POA: Diagnosis not present

## 2015-03-19 DIAGNOSIS — D485 Neoplasm of uncertain behavior of skin: Secondary | ICD-10-CM | POA: Diagnosis not present

## 2015-03-19 DIAGNOSIS — M545 Low back pain: Secondary | ICD-10-CM | POA: Diagnosis not present

## 2015-03-19 DIAGNOSIS — M6281 Muscle weakness (generalized): Secondary | ICD-10-CM | POA: Diagnosis not present

## 2015-04-01 DIAGNOSIS — L570 Actinic keratosis: Secondary | ICD-10-CM | POA: Diagnosis not present

## 2015-05-05 ENCOUNTER — Other Ambulatory Visit: Payer: 59

## 2015-05-07 DIAGNOSIS — R739 Hyperglycemia, unspecified: Secondary | ICD-10-CM | POA: Diagnosis not present

## 2015-05-07 DIAGNOSIS — I10 Essential (primary) hypertension: Secondary | ICD-10-CM | POA: Diagnosis not present

## 2015-05-07 DIAGNOSIS — R740 Nonspecific elevation of levels of transaminase and lactic acid dehydrogenase [LDH]: Secondary | ICD-10-CM | POA: Diagnosis not present

## 2015-05-08 ENCOUNTER — Ambulatory Visit
Admission: RE | Admit: 2015-05-08 | Discharge: 2015-05-08 | Disposition: A | Discharge status: Home / Self Care | Payer: Medicare Other | Source: Ambulatory Visit | Attending: Internal Medicine | Admitting: Internal Medicine

## 2015-05-08 DIAGNOSIS — R740 Nonspecific elevation of levels of transaminase and lactic acid dehydrogenase [LDH]: Principal | ICD-10-CM

## 2015-05-08 DIAGNOSIS — R945 Abnormal results of liver function studies: Secondary | ICD-10-CM | POA: Diagnosis not present

## 2015-05-08 DIAGNOSIS — R7401 Elevation of levels of liver transaminase levels: Secondary | ICD-10-CM

## 2015-05-12 DIAGNOSIS — I10 Essential (primary) hypertension: Secondary | ICD-10-CM | POA: Diagnosis not present

## 2015-05-12 DIAGNOSIS — R739 Hyperglycemia, unspecified: Secondary | ICD-10-CM | POA: Diagnosis not present

## 2015-05-12 DIAGNOSIS — R740 Nonspecific elevation of levels of transaminase and lactic acid dehydrogenase [LDH]: Secondary | ICD-10-CM | POA: Diagnosis not present

## 2015-06-09 ENCOUNTER — Other Ambulatory Visit (HOSPITAL_COMMUNITY): Payer: Self-pay | Admitting: *Deleted

## 2015-06-10 ENCOUNTER — Ambulatory Visit (HOSPITAL_COMMUNITY)
Admission: RE | Admit: 2015-06-10 | Discharge: 2015-06-10 | Disposition: A | Discharge status: Home / Self Care | Payer: Medicare Other | Source: Ambulatory Visit | Attending: Internal Medicine | Admitting: Internal Medicine

## 2015-06-10 LAB — POCT HEMOGLOBIN-HEMACUE: Hemoglobin: 14.9 g/dL (ref 12.0–15.0)

## 2015-07-07 ENCOUNTER — Other Ambulatory Visit: Payer: Self-pay | Admitting: Internal Medicine

## 2015-07-08 ENCOUNTER — Encounter (HOSPITAL_COMMUNITY)
Admission: RE | Admit: 2015-07-08 | Discharge: 2015-07-08 | Disposition: A | Discharge status: Home / Self Care | Payer: Medicare Other | Source: Ambulatory Visit | Attending: Internal Medicine | Admitting: Internal Medicine

## 2015-07-08 NOTE — Progress Notes (Signed)
Phlebotomized 250cc from left AC per order and pt tolerated procedure well.

## 2015-07-25 ENCOUNTER — Other Ambulatory Visit (HOSPITAL_COMMUNITY): Payer: Self-pay | Admitting: *Deleted

## 2015-07-28 ENCOUNTER — Encounter (HOSPITAL_COMMUNITY)
Admission: RE | Admit: 2015-07-28 | Discharge: 2015-07-28 | Disposition: A | Discharge status: Home / Self Care | Payer: Medicare Other | Source: Ambulatory Visit | Attending: Internal Medicine | Admitting: Internal Medicine

## 2015-07-28 LAB — POCT HEMOGLOBIN-HEMACUE: Hemoglobin: 15.1 g/dL — ABNORMAL HIGH (ref 12.0–15.0)

## 2015-08-06 DIAGNOSIS — I10 Essential (primary) hypertension: Secondary | ICD-10-CM | POA: Diagnosis not present

## 2015-08-06 DIAGNOSIS — R739 Hyperglycemia, unspecified: Secondary | ICD-10-CM | POA: Diagnosis not present

## 2015-08-06 DIAGNOSIS — R740 Nonspecific elevation of levels of transaminase and lactic acid dehydrogenase [LDH]: Secondary | ICD-10-CM | POA: Diagnosis not present

## 2015-08-06 DIAGNOSIS — E7800 Pure hypercholesterolemia, unspecified: Secondary | ICD-10-CM | POA: Diagnosis not present

## 2015-08-11 DIAGNOSIS — G8929 Other chronic pain: Secondary | ICD-10-CM | POA: Diagnosis not present

## 2015-08-11 DIAGNOSIS — I10 Essential (primary) hypertension: Secondary | ICD-10-CM | POA: Diagnosis not present

## 2015-08-11 DIAGNOSIS — M5440 Lumbago with sciatica, unspecified side: Secondary | ICD-10-CM | POA: Diagnosis not present

## 2015-08-11 DIAGNOSIS — E7800 Pure hypercholesterolemia, unspecified: Secondary | ICD-10-CM | POA: Diagnosis not present

## 2015-08-15 DIAGNOSIS — M5416 Radiculopathy, lumbar region: Secondary | ICD-10-CM | POA: Diagnosis not present

## 2015-08-15 DIAGNOSIS — M4316 Spondylolisthesis, lumbar region: Secondary | ICD-10-CM | POA: Diagnosis not present

## 2015-08-15 DIAGNOSIS — M4726 Other spondylosis with radiculopathy, lumbar region: Secondary | ICD-10-CM | POA: Diagnosis not present

## 2015-08-15 DIAGNOSIS — M5136 Other intervertebral disc degeneration, lumbar region: Secondary | ICD-10-CM | POA: Diagnosis not present

## 2015-08-15 DIAGNOSIS — Z6833 Body mass index (BMI) 33.0-33.9, adult: Secondary | ICD-10-CM | POA: Diagnosis not present

## 2015-09-18 ENCOUNTER — Other Ambulatory Visit (HOSPITAL_COMMUNITY): Payer: Self-pay | Admitting: *Deleted

## 2015-09-18 ENCOUNTER — Other Ambulatory Visit: Payer: Self-pay | Admitting: Internal Medicine

## 2015-09-19 ENCOUNTER — Encounter (HOSPITAL_COMMUNITY)
Admission: RE | Admit: 2015-09-19 | Discharge: 2015-09-19 | Disposition: A | Discharge status: Home / Self Care | Payer: Medicare Other | Source: Ambulatory Visit | Attending: Internal Medicine | Admitting: Internal Medicine

## 2015-09-19 LAB — POCT HEMOGLOBIN-HEMACUE: HEMOGLOBIN: 11 g/dL — AB (ref 12.0–15.0)

## 2015-09-19 NOTE — Progress Notes (Signed)
HGB 11.0 today; 223ml phlebotomy complete accessing right antecub; pt. Tolerated well

## 2015-10-17 ENCOUNTER — Encounter (HOSPITAL_COMMUNITY)
Admission: RE | Admit: 2015-10-17 | Discharge: 2015-10-17 | Disposition: A | Discharge status: Home / Self Care | Payer: Medicare Other | Source: Ambulatory Visit | Attending: Internal Medicine | Admitting: Internal Medicine

## 2015-10-17 LAB — POCT HEMOGLOBIN-HEMACUE: Hemoglobin: 12.7 g/dL (ref 12.0–15.0)

## 2015-10-17 NOTE — Progress Notes (Signed)
Pt came in today for scheduled therapeutic Phlebotomy.  HemoCue prior to procedure is 12.7.  Per MD order PT had 250 cc of blood removed from left AC.  PT tolerated well and we will monitor for 15 more minutes.

## 2015-11-11 DIAGNOSIS — I10 Essential (primary) hypertension: Secondary | ICD-10-CM | POA: Diagnosis not present

## 2015-11-11 DIAGNOSIS — M4726 Other spondylosis with radiculopathy, lumbar region: Secondary | ICD-10-CM | POA: Diagnosis not present

## 2015-11-11 DIAGNOSIS — Z6833 Body mass index (BMI) 33.0-33.9, adult: Secondary | ICD-10-CM | POA: Diagnosis not present

## 2015-11-11 DIAGNOSIS — M5136 Other intervertebral disc degeneration, lumbar region: Secondary | ICD-10-CM | POA: Diagnosis not present

## 2015-11-11 DIAGNOSIS — Z9889 Other specified postprocedural states: Secondary | ICD-10-CM | POA: Diagnosis not present

## 2015-11-14 DIAGNOSIS — R739 Hyperglycemia, unspecified: Secondary | ICD-10-CM | POA: Diagnosis not present

## 2015-11-14 DIAGNOSIS — R740 Nonspecific elevation of levels of transaminase and lactic acid dehydrogenase [LDH]: Secondary | ICD-10-CM | POA: Diagnosis not present

## 2015-11-14 DIAGNOSIS — I10 Essential (primary) hypertension: Secondary | ICD-10-CM | POA: Diagnosis not present

## 2015-11-20 ENCOUNTER — Other Ambulatory Visit (HOSPITAL_COMMUNITY): Payer: Self-pay | Admitting: *Deleted

## 2015-11-21 ENCOUNTER — Encounter (HOSPITAL_COMMUNITY)
Admission: RE | Admit: 2015-11-21 | Discharge: 2015-11-21 | Disposition: A | Discharge status: Home / Self Care | Payer: Medicare Other | Source: Ambulatory Visit | Attending: Internal Medicine | Admitting: Internal Medicine

## 2015-11-21 LAB — POCT HEMOGLOBIN-HEMACUE: HEMOGLOBIN: 13.4 g/dL (ref 12.0–15.0)

## 2015-12-16 DIAGNOSIS — M5417 Radiculopathy, lumbosacral region: Secondary | ICD-10-CM | POA: Diagnosis not present

## 2015-12-18 ENCOUNTER — Other Ambulatory Visit (HOSPITAL_COMMUNITY): Payer: Self-pay | Admitting: *Deleted

## 2015-12-18 ENCOUNTER — Other Ambulatory Visit: Payer: Self-pay | Admitting: Internal Medicine

## 2015-12-18 DIAGNOSIS — M5416 Radiculopathy, lumbar region: Secondary | ICD-10-CM

## 2015-12-19 ENCOUNTER — Encounter (HOSPITAL_COMMUNITY)
Admission: RE | Admit: 2015-12-19 | Discharge: 2015-12-19 | Disposition: A | Discharge status: Home / Self Care | Payer: Medicare Other | Source: Ambulatory Visit | Attending: Internal Medicine | Admitting: Internal Medicine

## 2015-12-19 LAB — POCT HEMOGLOBIN-HEMACUE: Hemoglobin: 14.9 g/dL (ref 12.0–15.0)

## 2015-12-29 ENCOUNTER — Ambulatory Visit
Admission: RE | Admit: 2015-12-29 | Discharge: 2015-12-29 | Disposition: A | Discharge status: Home / Self Care | Payer: Medicare Other | Source: Ambulatory Visit | Attending: Internal Medicine | Admitting: Internal Medicine

## 2015-12-29 DIAGNOSIS — M5416 Radiculopathy, lumbar region: Secondary | ICD-10-CM

## 2015-12-29 DIAGNOSIS — M5126 Other intervertebral disc displacement, lumbar region: Secondary | ICD-10-CM | POA: Diagnosis not present

## 2016-01-21 ENCOUNTER — Other Ambulatory Visit (HOSPITAL_COMMUNITY): Payer: Self-pay | Admitting: *Deleted

## 2016-01-22 ENCOUNTER — Encounter (HOSPITAL_COMMUNITY)
Admission: RE | Admit: 2016-01-22 | Discharge: 2016-01-22 | Disposition: A | Discharge status: Home / Self Care | Payer: Medicare Other | Source: Ambulatory Visit | Attending: Internal Medicine | Admitting: Internal Medicine

## 2016-01-22 LAB — POCT HEMOGLOBIN-HEMACUE: HEMOGLOBIN: 16 g/dL — AB (ref 12.0–15.0)

## 2016-01-22 NOTE — Progress Notes (Signed)
Pt came in today for scheduled therapeutic Phlebotomy.  HemoCue prior to procedure was 16.  Per MD order pt had 250 cc of blood removed from right AC.  Pt tolerated well.

## 2016-02-04 ENCOUNTER — Encounter: Payer: Self-pay | Admitting: Internal Medicine

## 2016-02-11 DIAGNOSIS — R739 Hyperglycemia, unspecified: Secondary | ICD-10-CM | POA: Diagnosis not present

## 2016-02-11 DIAGNOSIS — I10 Essential (primary) hypertension: Secondary | ICD-10-CM | POA: Diagnosis not present

## 2016-02-16 ENCOUNTER — Encounter: Payer: Self-pay | Admitting: Internal Medicine

## 2016-02-17 DIAGNOSIS — Z23 Encounter for immunization: Secondary | ICD-10-CM | POA: Diagnosis not present

## 2016-02-17 DIAGNOSIS — E7800 Pure hypercholesterolemia, unspecified: Secondary | ICD-10-CM | POA: Diagnosis not present

## 2016-02-17 DIAGNOSIS — R739 Hyperglycemia, unspecified: Secondary | ICD-10-CM | POA: Diagnosis not present

## 2016-02-23 ENCOUNTER — Encounter (HOSPITAL_COMMUNITY)
Admission: RE | Admit: 2016-02-23 | Discharge: 2016-02-23 | Disposition: A | Discharge status: Home / Self Care | Payer: Medicare Other | Source: Ambulatory Visit | Attending: Internal Medicine | Admitting: Internal Medicine

## 2016-02-23 LAB — POCT HEMOGLOBIN-HEMACUE: Hemoglobin: 12.1 g/dL (ref 12.0–15.0)

## 2016-02-23 NOTE — Progress Notes (Signed)
hemocue 12.1 today.  250cc removed and pt tolerated well

## 2016-03-16 DIAGNOSIS — Z1231 Encounter for screening mammogram for malignant neoplasm of breast: Secondary | ICD-10-CM | POA: Diagnosis not present

## 2016-03-23 ENCOUNTER — Encounter (HOSPITAL_COMMUNITY)
Admission: RE | Admit: 2016-03-23 | Discharge: 2016-03-23 | Disposition: A | Discharge status: Home / Self Care | Payer: Medicare Other | Source: Ambulatory Visit | Attending: Internal Medicine | Admitting: Internal Medicine

## 2016-03-23 NOTE — Progress Notes (Signed)
Phlebotomy hgb 13.9 prior to phlebotomy. 250gm removed without difficulty from left anticubital. Tol well

## 2016-03-24 LAB — POCT HEMOGLOBIN-HEMACUE: HEMOGLOBIN: 13.9 g/dL (ref 12.0–15.0)

## 2016-04-16 ENCOUNTER — Other Ambulatory Visit (HOSPITAL_COMMUNITY): Payer: Self-pay | Admitting: *Deleted

## 2016-04-16 ENCOUNTER — Ambulatory Visit: Payer: Medicare Other | Admitting: *Deleted

## 2016-04-16 VITALS — Ht 64.0 in | Wt 200.2 lb

## 2016-04-16 DIAGNOSIS — Z86010 Personal history of colonic polyps: Secondary | ICD-10-CM

## 2016-04-16 NOTE — Progress Notes (Signed)
Denies allergies to eggs or soy products. Denies complications with sedation or anesthesia. Denies O2 use. Denies use of diet or weight loss medications.  Emmi instructions given for colonoscopy.  

## 2016-04-19 ENCOUNTER — Encounter (HOSPITAL_COMMUNITY)
Admission: RE | Admit: 2016-04-19 | Discharge: 2016-04-19 | Disposition: A | Discharge status: Home / Self Care | Payer: Medicare Other | Source: Ambulatory Visit | Attending: Internal Medicine | Admitting: Internal Medicine

## 2016-04-19 LAB — POCT HEMOGLOBIN-HEMACUE: HEMOGLOBIN: 16.4 g/dL — AB (ref 12.0–15.0)

## 2016-04-19 NOTE — Progress Notes (Signed)
Pt was in for therapeutic phlebotomy, 250 ml removed via right antecubital after HemoCue checked which was 16.4. Pt tolerated well.

## 2016-04-21 ENCOUNTER — Ambulatory Visit (INDEPENDENT_AMBULATORY_CARE_PROVIDER_SITE_OTHER): Payer: Medicare Other | Admitting: Physician Assistant

## 2016-04-21 ENCOUNTER — Encounter: Payer: Self-pay | Admitting: Physician Assistant

## 2016-04-21 VITALS — BP 112/86 | HR 84 | Ht 64.0 in | Wt 198.2 lb

## 2016-04-21 DIAGNOSIS — R1319 Other dysphagia: Secondary | ICD-10-CM

## 2016-04-21 DIAGNOSIS — Z86010 Personal history of colonic polyps: Secondary | ICD-10-CM

## 2016-04-21 DIAGNOSIS — R1310 Dysphagia, unspecified: Secondary | ICD-10-CM | POA: Diagnosis not present

## 2016-04-21 NOTE — Patient Instructions (Signed)

## 2016-04-21 NOTE — Progress Notes (Addendum)
Subjective:    Patient ID: Miranda Stark, female    DOB: [birth year], 66 y.o.   MRN: [id number]  HPI Miranda Stark is a 66 -year-old white female known to Dr. Carlean Purl. She has history of a sessile serrated adenomatous colon polyp at colonoscopy in November 2012 and is due for follow-up colonoscopy. She had gone to previsit for scheduling and mentioned recurrent problems with dysphagia and was therefore scheduled for an office visit. She had EGD done in November 2012 as well which was normal. There was no evidence of stricture but she did have empiric dilation to 15 [place] due to symptoms. Patient states that she feels like that procedure did help her and she had not had any problems with dysphagia for several years. She says now over the past 2 years she has noticed some recurrent intermittent solid food dysphagia. She has not had any episodes of regurgitation and usually says if she just stops eating NSAIDs very upright food will pass without difficulty. These episodes are occurring a couple of times per month. He has been taking Tums at least 4-5 daily because she was diagnosed with hemochromatosis and has been told that this will help with absorption of iron. She says with the Tums she is usually not having any heartburn or indigestion.  She is being followed by Dr. Maudie Mercury for her hemachromatosis and has been having phlebotomies over the past several months. Her iron levels have been improving.  Review of Systems Pertinent positive and negative review of systems were noted in the above HPI section.  All other review of systems was otherwise negative.  Outpatient Encounter Prescriptions as of 04/21/2016  Medication Sig  . amphetamine-dextroamphetamine (ADDERALL) 5 MG tablet Take 5 mg by mouth as needed.   Marland Kitchen aspirin EC 81 MG tablet Take 81 mg by mouth daily.  Marland Kitchen azelastine (ASTELIN) 0.1 % nasal spray Place 1 spray into both nostrils 2 (two) times daily as needed for rhinitis or allergies.  . bisacodyl  (DULCOLAX) 5 MG EC tablet Take 5 mg by mouth once. One time use for colonoscopy  . Calcium Carbonate Antacid (TUMS E-X 750 PO) Take 4 capsules by mouth daily.  . cyclobenzaprine (FLEXERIL) 10 MG tablet Take 10 mg by mouth at bedtime as needed (fibromyalgia).   . Esomeprazole Magnesium (NEXIUM PO) Take 1 capsule by mouth daily as needed (acid reflux).  Marland Kitchen HYDROcodone-acetaminophen (NORCO/VICODIN) 5-325 MG per tablet Take 1-2 tablets by mouth every 4 (four) hours as needed (mild pain).  Marland Kitchen loratadine (CLARITIN) 10 MG tablet Take 10 mg by mouth daily as needed for allergies.  Marland Kitchen LORazepam (ATIVAN) 1 MG tablet Take 1 mg by mouth at bedtime as needed for sleep.   Marland Kitchen losartan (COZAAR) 100 MG tablet Take 50 mg by mouth daily after supper.   . Magnesium 500 MG TABS Take 500 mg by mouth every other day.  . Melatonin 10 MG TABS Take 1 tablet by mouth at bedtime as needed.  . metFORMIN (GLUCOPHAGE) 500 MG tablet Take 500 mg by mouth daily.  Marland Kitchen oxyCODONE-acetaminophen (PERCOCET/ROXICET) 5-325 MG per tablet Take 2 tablets by mouth every 4 (four) hours as needed for severe pain.  . polyethylene glycol powder (MIRALAX) powder Take 1 Container by mouth once. One time use for colonoscopy   No facility-administered encounter medications on file as of 04/21/2016.    Allergies  Allergen Reactions  . Actifed Cold-Allergy [Chlorpheniramine-Phenyleph Er] Other (See Comments)    tachycardia  . Ampicillin Other (See Comments)  Causes yeast  . Beef-Derived Products Other (See Comments)    Mood swings  . Benadryl [Diphenhydramine] Other (See Comments)    Restless legs  . Darvocet [Propoxyphene N-Acetaminophen] Other (See Comments)    Floating sensation  . Darvon Compound Other (See Comments)    Floating sensation  . Indomethacin Other (See Comments)    Diarrhea, burning sensation lower abd  . Tofranil-Pm Other (See Comments)    Drug educed hepatitis  . Ultram [Tramadol Hcl] Other (See Comments)    Severe syncope    Patient Active Problem List   Diagnosis Date Noted  . HNP (herniated nucleus pulposus), lumbar 08/15/2014  . Nonallergic rhinitis 11/16/2012  . Bronchitis, chronic obstructive, with exacerbation ([place]) 11/16/2012  . Personal history of adenomatous colonic polyps 02/10/2011  . GERD (gastroesophageal reflux disease) 01/01/2011   Social History   Social History  . Marital status: Married    Spouse name: N/A  . Number of children: 1  . Years of education: N/A   Occupational History  . retired    Social History Main Topics  . Smoking status: Former Smoker    Packs/day: 0.20    Years: 8.00    Types: Cigarettes    Quit date: 03/29/2010  . Smokeless tobacco: Never Used  . Alcohol use Yes     Comment: ocassionaly  . Drug use: No  . Sexual activity: Not Currently   Other Topics Concern  . Not on file   Social History Narrative   1-2 caffeine drinks a day    Ms. Miranda Stark family history includes Basal cell carcinoma in her mother; Diabetes in her mother; Hypertension in her mother; Kidney failure in her brother; Lung disease in her mother; Mental illness in her mother; Osteoporosis in her mother; Sleep apnea in her sister.      Objective:    Vitals:   04/21/16 1452  BP: 112/86  Pulse: 84    Physical Exam  well-developed white female in no acute distress, pleasant a pressure 112/86 pulse 84, height 5 foot 4 weight 198 BMI 34. HEENT; nontraumatic normocephalic EOMI PERRLA sclera anicteric, Cardiovascular; regular rate and rhythm with S1-S2 no murmur or gallop, Pulmonary ;clear bilaterally, Abdomen ;soft, nontender nondistended bowel sounds are active there is no palpable mass or hepatosplenomegaly, Rectal; exam not done, Extremities; no clubbing cyanosis or edema skin warm and dry, Neuropsych; mood and affect appropriate       Assessment & Plan:   #39 66 year old female due for follow-up colonoscopy with history of sessile serrated adenomatous polyp November 2012 #2  intermittent solid food dysphagia-rule out subtle stricture versus spasm. Previous EGD 2012 negative but empirically dilated with good response #3 hemachromatosis #4  chronic back pain, status post lumbar laminectomy  Plan; Patient will be scheduled for colonoscopy and EGD with probable esophageal dilation with Dr. Carlean Purl. Both procedures were discussed in detail with the patient including risks and benefits and she is agreeable to proceed.  Amy Genia Harold PA-C 04/21/2016   Cc: [name], MD   Agree with Ms. Genia Harold assessment and plan. Gatha Mayer, MD, [facility]

## 2016-04-30 ENCOUNTER — Encounter: Payer: Medicare Other | Admitting: Internal Medicine

## 2016-05-24 ENCOUNTER — Encounter (HOSPITAL_COMMUNITY)
Admission: RE | Admit: 2016-05-24 | Discharge: 2016-05-24 | Disposition: A | Discharge status: Home / Self Care | Payer: Medicare Other | Source: Ambulatory Visit | Attending: Internal Medicine | Admitting: Internal Medicine

## 2016-05-24 LAB — POCT HEMOGLOBIN-HEMACUE: HEMOGLOBIN: 13.5 g/dL (ref 12.0–15.0)

## 2016-05-24 NOTE — Progress Notes (Signed)
Pt came in today for scheduled therapeutic phlebotomy.  PT's HemoCue prior to procedure was 13.8  Right AC was used.  250 cc was removed per MD order.  PT tolerated procedure well.  Phlebotomy was done by Dorothea Glassman RN

## 2016-06-15 DIAGNOSIS — R739 Hyperglycemia, unspecified: Secondary | ICD-10-CM | POA: Diagnosis not present

## 2016-06-15 DIAGNOSIS — E7800 Pure hypercholesterolemia, unspecified: Secondary | ICD-10-CM | POA: Diagnosis not present

## 2016-06-15 DIAGNOSIS — I10 Essential (primary) hypertension: Secondary | ICD-10-CM | POA: Diagnosis not present

## 2016-06-16 ENCOUNTER — Encounter: Payer: Self-pay | Admitting: Internal Medicine

## 2016-06-16 ENCOUNTER — Ambulatory Visit (AMBULATORY_SURGERY_CENTER): Payer: Medicare Other | Admitting: Internal Medicine

## 2016-06-16 VITALS — BP 120/58 | HR 68 | Temp 98.0°F | Resp 20 | Ht 64.0 in | Wt 200.0 lb

## 2016-06-16 DIAGNOSIS — R1310 Dysphagia, unspecified: Secondary | ICD-10-CM | POA: Diagnosis not present

## 2016-06-16 DIAGNOSIS — Z86010 Personal history of colonic polyps: Secondary | ICD-10-CM

## 2016-06-16 DIAGNOSIS — K222 Esophageal obstruction: Secondary | ICD-10-CM | POA: Diagnosis not present

## 2016-06-16 DIAGNOSIS — I10 Essential (primary) hypertension: Secondary | ICD-10-CM | POA: Diagnosis not present

## 2016-06-16 DIAGNOSIS — E119 Type 2 diabetes mellitus without complications: Secondary | ICD-10-CM | POA: Diagnosis not present

## 2016-06-16 DIAGNOSIS — K219 Gastro-esophageal reflux disease without esophagitis: Secondary | ICD-10-CM | POA: Diagnosis not present

## 2016-06-16 MED ORDER — SODIUM CHLORIDE 0.9 % IV SOLN: 500.0000 mL | INTRAVENOUS | Status: DC

## 2016-06-16 NOTE — Op Note (Signed)
[facility] Patient Name: Miranda Stark Procedure Date: 06/16/2016 8:05 AM MRN: [id number] Endoscopist: Gatha Mayer , MD Age: 66 Referring MD:  Date of Birth: 06-Sep-1950 Gender: Female Account #: [id number] Procedure:                Upper GI endoscopy Indications:              Dysphagia Medicines:                Propofol per Anesthesia, Monitored Anesthesia Care Procedure:                Pre-Anesthesia Assessment:                           - Prior to the procedure, a History and Physical                            was performed, and patient medications and                            allergies were reviewed. The patient's tolerance of                            previous anesthesia was also reviewed. The risks                            and benefits of the procedure and the sedation                            options and risks were discussed with the patient.                            All questions were answered, and informed consent                            was obtained. Prior Anticoagulants: The patient has                            taken no previous anticoagulant or antiplatelet                            agents. ASA Grade Assessment: II - A patient with                            mild systemic disease. After reviewing the risks                            and benefits, the patient was deemed in                            satisfactory condition to undergo the procedure.                           After obtaining informed consent, the endoscope was  passed under direct vision. Throughout the                            procedure, the patient's blood pressure, pulse, and                            oxygen saturations were monitored continuously. The                            Endoscope was introduced through the mouth, and                            advanced to the second part of duodenum. The upper                            GI endoscopy was  accomplished without difficulty.                            The patient tolerated the procedure well. Scope In: Scope Out: Findings:                 One mild benign-appearing, intrinsic stenosis was                            found at the gastroesophageal junction. And was                            traversed. The scope was withdrawn. Dilation was                            performed with a Maloney dilator with mild                            resistance at 14 Fr. The dilation site was examined                            following endoscope reinsertion and showed no                            change. Estimated blood loss: none.                           A 1 cm hiatal hernia was present.                           The exam was otherwise without abnormality.                           The cardia and gastric fundus were normal on                            retroflexion. Complications:            No immediate complications. Estimated Blood Loss:     Estimated blood loss: none. Impression:               -  Benign-appearing esophageal stenosis. Dilated.                           - 1 cm hiatal hernia.                           - The examination was otherwise normal.                           - No specimens collected. Recommendation:           - Patient has a contact number available for                            emergencies. The signs and symptoms of potential                            delayed complications were discussed with the                            patient. Return to normal activities tomorrow.                            Written discharge instructions were provided to the                            patient.                           - Clear liquids x 1 hour then soft foods rest of                            day. Start prior diet tomorrow.                           - Continue present medications.                           - If dysphagia fails to resolve call back and                             discuss with Dr. Reino Kent, MD 06/16/2016 8:40:11 AM This report has been signed electronically.

## 2016-06-16 NOTE — Progress Notes (Signed)
Report to PACU, RN, vss, BBS= Clear.  

## 2016-06-16 NOTE — Progress Notes (Signed)
Pt's states no medical or surgical changes since previsit or office visit. 

## 2016-06-16 NOTE — Progress Notes (Signed)
Called to room to assist during endoscopic procedure.  Patient ID and intended procedure confirmed with present staff. Received instructions for my participation in the procedure from the performing physician.  

## 2016-06-16 NOTE — Op Note (Signed)
[facility] Patient Name: Gauri Galvao Procedure Date: 06/16/2016 8:04 AM MRN: [id number] Endoscopist: Gatha Mayer , MD Age: 66 Referring MD:  Date of Birth: 07/30/1950 Gender: Female Account #: [id number] Procedure:                Colonoscopy Indications:              Surveillance: Personal history of adenomatous                            polyps on last colonoscopy 5 years ago Medicines:                Propofol per Anesthesia, Monitored Anesthesia Care Procedure:                Pre-Anesthesia Assessment:                           - Prior to the procedure, a History and Physical                            was performed, and patient medications and                            allergies were reviewed. The patient's tolerance of                            previous anesthesia was also reviewed. The risks                            and benefits of the procedure and the sedation                            options and risks were discussed with the patient.                            All questions were answered, and informed consent                            was obtained. Prior Anticoagulants: The patient has                            taken no previous anticoagulant or antiplatelet                            agents. ASA Grade Assessment: II - A patient with                            mild systemic disease. After reviewing the risks                            and benefits, the patient was deemed in                            satisfactory condition to undergo the procedure.  After obtaining informed consent, the colonoscope                            was passed under direct vision. Throughout the                            procedure, the patient's blood pressure, pulse, and                            oxygen saturations were monitored continuously. The                            Model PCF-H190DL [id number]) scope was introduced   through the anus and advanced to the the cecum,                            identified by appendiceal orifice and ileocecal                            valve. The colonoscopy was performed without                            difficulty. The patient tolerated the procedure                            well. The quality of the bowel preparation was                            excellent. The bowel preparation used was Miralax.                            The ileocecal valve, appendiceal orifice, and                            rectum were photographed. Scope In: 1:69:45 AM Scope Out: 8:27:12 AM Scope Withdrawal Time: 0 hours 7 minutes 47 seconds  Total Procedure Duration: 0 hours 10 minutes 58 seconds  Findings:                 The perianal and digital rectal examinations were                            normal.                           The entire examined colon appeared normal on direct                            and retroflexion views. Complications:            No immediate complications. Estimated Blood Loss:     Estimated blood loss: none. Impression:               - The entire examined colon is normal on direct and  retroflexion views.                           - No specimens collected.                           - Personal history of colonic polyp. Diminutive                            adenoma 2012 Recommendation:           - Patient has a contact number available for                            emergencies. The signs and symptoms of potential                            delayed complications were discussed with the                            patient. Return to normal activities tomorrow.                            Written discharge instructions were provided to the                            patient.                           - Clear liquids x 1 hour then soft foods rest of                            day. Start prior diet tomorrow.                           -  Continue present medications.                           - Repeat colonoscopy in 10 years for surveillance. Gatha Mayer, MD 06/16/2016 8:42:20 AM This report has been signed electronically.

## 2016-06-16 NOTE — Patient Instructions (Addendum)
I dilated the esophagus again - mildly narrow at gastroesophageal junction. Small hiatal hernia. Otherwise ok.  Colon looked great - no polyps. Next routine colonoscopy or screening test in 10 years - 2028   I appreciate the opportunity to care for you. Gatha Mayer, MD, FACG YOU HAD AN ENDOSCOPIC PROCEDURE TODAY AT [facility]:   Refer to the procedure report that was given to you for any specific questions about what was found during the examination.  If the procedure report does not answer your questions, please call your gastroenterologist to clarify.  If you requested that your care partner not be given the details of your procedure findings, then the procedure report has been included in a sealed envelope for you to review at your convenience later.  YOU SHOULD EXPECT: Some feelings of bloating in the abdomen. Passage of more gas than usual.  Walking can help get rid of the air that was put into your GI tract during the procedure and reduce the bloating. If you had a lower endoscopy (such as a colonoscopy or flexible sigmoidoscopy) you may notice spotting of blood in your stool or on the toilet paper. If you underwent a bowel prep for your procedure, you may not have a normal bowel movement for a few days.  Please Note:  You might notice some irritation and congestion in your nose or some drainage.  This is from the oxygen used during your procedure.  There is no need for concern and it should clear up in a day or so.  SYMPTOMS TO REPORT IMMEDIATELY:   Following lower endoscopy (colonoscopy or flexible sigmoidoscopy):  Excessive amounts of blood in the stool  Significant tenderness or worsening of abdominal pains  Swelling of the abdomen that is new, acute  Fever of 100F or higher   Following upper endoscopy (EGD)  Vomiting of blood or coffee ground material  New chest pain or pain under the shoulder blades  Painful or persistently difficult  swallowing  New shortness of breath  Fever of 100F or higher  Black, tarry-looking stools  For urgent or emergent issues, a gastroenterologist can be reached at any hour by calling [phone].   DIET: Please Follow Post Esophageal Dilation Diet.  Drink plenty of fluids but you should avoid alcoholic beverages for 24 hours.  ACTIVITY:  You should plan to take it easy for the rest of today and you should NOT DRIVE or use heavy machinery until tomorrow (because of the sedation medicines used during the test).    FOLLOW UP: Our staff will call the number listed on your records the next business day following your procedure to check on you and address any questions or concerns that you may have regarding the information given to you following your procedure. If we do not reach you, we will leave a message.  However, if you are feeling well and you are not experiencing any problems, there is no need to return our call.  We will assume that you have returned to your regular daily activities without incident.  If any biopsies were taken you will be contacted by phone or by letter within the next 1-3 weeks.  Please call us at [phone] if you have not heard about the biopsies in 3 weeks.   Post Dilation Diet (handout given) Esophagitis and Stricture (handout given) Hiatal Hernia (handout given)   SIGNATURES/CONFIDENTIALITY: You and/or your care partner have signed paperwork which will be entered into your electronic  medical record.  These signatures attest to the fact that that the information above on your After Visit Summary has been reviewed and is understood.  Full responsibility of the confidentiality of this discharge information lies with you and/or your care-partner.

## 2016-06-17 ENCOUNTER — Telehealth: Payer: Self-pay | Admitting: *Deleted

## 2016-06-17 NOTE — Telephone Encounter (Signed)
  Follow up Call-  Call back number 06/16/2016  Post procedure Call Back phone  # [phone]  Permission to leave phone message Yes  Some recent data might be hidden     Patient questions:  Do you have a fever, pain , or abdominal swelling? No. Pain Score  0 *  Have you tolerated food without any problems? Yes.    Have you been able to return to your normal activities? Yes.    Do you have any questions about your discharge instructions: Diet   No. Medications  No. Follow up visit  No.  Do you have questions or concerns about your Care? No.  Actions: * If pain score is 4 or above: No action needed, pain <4.

## 2016-06-21 ENCOUNTER — Encounter (HOSPITAL_COMMUNITY)
Admission: RE | Admit: 2016-06-21 | Discharge: 2016-06-21 | Disposition: A | Discharge status: Home / Self Care | Payer: Medicare Other | Source: Ambulatory Visit | Attending: Internal Medicine | Admitting: Internal Medicine

## 2016-06-21 DIAGNOSIS — M79671 Pain in right foot: Secondary | ICD-10-CM | POA: Diagnosis not present

## 2016-06-21 DIAGNOSIS — K7689 Other specified diseases of liver: Secondary | ICD-10-CM | POA: Diagnosis not present

## 2016-06-21 DIAGNOSIS — E7800 Pure hypercholesterolemia, unspecified: Secondary | ICD-10-CM | POA: Diagnosis not present

## 2016-06-21 DIAGNOSIS — M79674 Pain in right toe(s): Secondary | ICD-10-CM | POA: Diagnosis not present

## 2016-06-21 LAB — POCT HEMOGLOBIN-HEMACUE: Hemoglobin: 13 g/dL (ref 12.0–15.0)

## 2016-06-21 NOTE — Progress Notes (Signed)
Pt came in today for scheduled therapeutic phlebotomy.  Pt's HemoCue was 13.0 prior to procedure.  Left AC was used.  500 cc was removed per MD order.  PT tolerated procedure well

## 2016-07-19 ENCOUNTER — Encounter (HOSPITAL_COMMUNITY)
Admission: RE | Admit: 2016-07-19 | Discharge: 2016-07-19 | Disposition: A | Discharge status: Home / Self Care | Payer: Medicare Other | Source: Ambulatory Visit | Attending: Internal Medicine | Admitting: Internal Medicine

## 2016-07-19 LAB — POCT HEMOGLOBIN-HEMACUE: HEMOGLOBIN: 14.4 g/dL (ref 12.0–15.0)

## 2016-07-19 NOTE — Progress Notes (Signed)
hemocue 14.4 today; phlebotomized 250cc per MD order from left [facility] and pt tolerated procedure well.

## 2016-08-13 ENCOUNTER — Other Ambulatory Visit (HOSPITAL_COMMUNITY): Payer: Self-pay | Admitting: *Deleted

## 2016-08-16 ENCOUNTER — Encounter (HOSPITAL_COMMUNITY)
Admission: RE | Admit: 2016-08-16 | Discharge: 2016-08-16 | Disposition: A | Discharge status: Home / Self Care | Payer: Medicare Other | Source: Ambulatory Visit | Attending: Internal Medicine | Admitting: Internal Medicine

## 2016-08-16 LAB — POCT HEMOGLOBIN-HEMACUE: HEMOGLOBIN: 12.5 g/dL (ref 12.0–15.0)

## 2016-08-16 NOTE — Progress Notes (Signed)
Pt came in today for scheduled therapeutic phlebotomy.  Pts HemoCue prior to procedure was 12.5  Right AC was used.  250 cc of blood was removed per MD order and protocol.  Pt tolerated procedure well.  Will continue to monitor

## 2016-09-17 ENCOUNTER — Other Ambulatory Visit (HOSPITAL_COMMUNITY): Payer: Self-pay | Admitting: *Deleted

## 2016-09-20 ENCOUNTER — Encounter (HOSPITAL_COMMUNITY)
Admission: RE | Admit: 2016-09-20 | Discharge: 2016-09-20 | Disposition: A | Discharge status: Home / Self Care | Payer: Medicare Other | Source: Ambulatory Visit | Attending: Internal Medicine | Admitting: Internal Medicine

## 2016-09-20 NOTE — Progress Notes (Signed)
Pt came in today for scheduled therapeutic.Marland Kitchen Her HGB prior to procedure was 13.0  Left AC was used.  250 cc was removed per MD order.  Pt tolerated procedure well.  Will continue to monitor

## 2016-09-21 LAB — POCT HEMOGLOBIN-HEMACUE: Hemoglobin: 13 g/dL (ref 12.0–15.0)

## 2016-10-15 DIAGNOSIS — E7800 Pure hypercholesterolemia, unspecified: Secondary | ICD-10-CM | POA: Diagnosis not present

## 2016-10-15 DIAGNOSIS — R739 Hyperglycemia, unspecified: Secondary | ICD-10-CM | POA: Diagnosis not present

## 2016-10-15 DIAGNOSIS — I10 Essential (primary) hypertension: Secondary | ICD-10-CM | POA: Diagnosis not present

## 2016-10-21 ENCOUNTER — Other Ambulatory Visit: Payer: Self-pay | Admitting: Internal Medicine

## 2016-10-21 ENCOUNTER — Other Ambulatory Visit (HOSPITAL_COMMUNITY): Payer: Self-pay | Admitting: *Deleted

## 2016-10-21 DIAGNOSIS — R634 Abnormal weight loss: Secondary | ICD-10-CM | POA: Diagnosis not present

## 2016-10-21 DIAGNOSIS — E7800 Pure hypercholesterolemia, unspecified: Secondary | ICD-10-CM | POA: Diagnosis not present

## 2016-10-21 DIAGNOSIS — R739 Hyperglycemia, unspecified: Secondary | ICD-10-CM | POA: Diagnosis not present

## 2016-10-21 DIAGNOSIS — R51 Headache: Secondary | ICD-10-CM | POA: Diagnosis not present

## 2016-10-22 ENCOUNTER — Encounter (HOSPITAL_COMMUNITY)
Admission: RE | Admit: 2016-10-22 | Discharge: 2016-10-22 | Disposition: A | Discharge status: Home / Self Care | Payer: Medicare Other | Source: Ambulatory Visit | Attending: Internal Medicine | Admitting: Internal Medicine

## 2016-10-22 LAB — POCT HEMOGLOBIN-HEMACUE: Hemoglobin: 13 g/dL (ref 12.0–15.0)

## 2016-10-22 NOTE — Progress Notes (Signed)
hemocue 13 today.  Removed 250cc from pt's right AC and pt tolerated well.

## 2016-10-27 ENCOUNTER — Ambulatory Visit
Admission: RE | Admit: 2016-10-27 | Discharge: 2016-10-27 | Disposition: A | Discharge status: Home / Self Care | Payer: Medicare Other | Source: Ambulatory Visit | Attending: Internal Medicine | Admitting: Internal Medicine

## 2016-10-27 DIAGNOSIS — K760 Fatty (change of) liver, not elsewhere classified: Secondary | ICD-10-CM | POA: Diagnosis not present

## 2016-10-27 DIAGNOSIS — R634 Abnormal weight loss: Secondary | ICD-10-CM

## 2016-12-23 ENCOUNTER — Other Ambulatory Visit: Payer: Self-pay | Admitting: Internal Medicine

## 2016-12-24 ENCOUNTER — Other Ambulatory Visit (HOSPITAL_COMMUNITY): Payer: Self-pay | Admitting: *Deleted

## 2016-12-24 ENCOUNTER — Encounter (HOSPITAL_COMMUNITY)
Admission: RE | Admit: 2016-12-24 | Discharge: 2016-12-24 | Disposition: A | Discharge status: Home / Self Care | Payer: Medicare Other | Source: Ambulatory Visit | Attending: Internal Medicine | Admitting: Internal Medicine

## 2016-12-24 LAB — POCT HEMOGLOBIN-HEMACUE: HEMOGLOBIN: 15.1 g/dL — AB (ref 12.0–15.0)

## 2016-12-24 NOTE — Progress Notes (Signed)
Used right AC and phlebotomized 250cc.  Pt tolerated well

## 2017-02-04 DIAGNOSIS — E7800 Pure hypercholesterolemia, unspecified: Secondary | ICD-10-CM | POA: Diagnosis not present

## 2017-02-04 DIAGNOSIS — R739 Hyperglycemia, unspecified: Secondary | ICD-10-CM | POA: Diagnosis not present

## 2017-02-10 DIAGNOSIS — Z23 Encounter for immunization: Secondary | ICD-10-CM | POA: Diagnosis not present

## 2017-02-10 DIAGNOSIS — R739 Hyperglycemia, unspecified: Secondary | ICD-10-CM | POA: Diagnosis not present

## 2017-02-10 DIAGNOSIS — F988 Other specified behavioral and emotional disorders with onset usually occurring in childhood and adolescence: Secondary | ICD-10-CM | POA: Diagnosis not present

## 2017-02-10 DIAGNOSIS — I10 Essential (primary) hypertension: Secondary | ICD-10-CM | POA: Diagnosis not present

## 2017-02-21 ENCOUNTER — Encounter (HOSPITAL_COMMUNITY)
Admission: RE | Admit: 2017-02-21 | Discharge: 2017-02-21 | Disposition: A | Discharge status: Home / Self Care | Payer: Medicare Other | Source: Ambulatory Visit | Attending: Internal Medicine | Admitting: Internal Medicine

## 2017-02-21 LAB — POCT HEMOGLOBIN-HEMACUE: HEMOGLOBIN: 14.3 g/dL (ref 12.0–15.0)

## 2017-02-21 NOTE — Progress Notes (Signed)
hemocue 14.3.  Phlebotomized 250cc from right arm and pt tolerated well

## 2017-02-22 ENCOUNTER — Inpatient Hospital Stay (HOSPITAL_COMMUNITY): Admission: RE | Admit: 2017-02-22 | Payer: Medicare Other | Source: Ambulatory Visit

## 2017-04-22 ENCOUNTER — Encounter (HOSPITAL_COMMUNITY)
Admission: RE | Admit: 2017-04-22 | Discharge: 2017-04-22 | Disposition: A | Discharge status: Home / Self Care | Payer: Medicare Other | Source: Ambulatory Visit | Attending: Internal Medicine | Admitting: Internal Medicine

## 2017-04-22 LAB — POCT HEMOGLOBIN-HEMACUE: Hemoglobin: 12.8 g/dL (ref 12.0–15.0)

## 2017-05-26 DIAGNOSIS — Z1231 Encounter for screening mammogram for malignant neoplasm of breast: Secondary | ICD-10-CM | POA: Diagnosis not present

## 2017-05-26 DIAGNOSIS — Z01419 Encounter for gynecological examination (general) (routine) without abnormal findings: Secondary | ICD-10-CM | POA: Diagnosis not present

## 2017-06-15 DIAGNOSIS — R739 Hyperglycemia, unspecified: Secondary | ICD-10-CM | POA: Diagnosis not present

## 2017-06-15 DIAGNOSIS — I10 Essential (primary) hypertension: Secondary | ICD-10-CM | POA: Diagnosis not present

## 2017-06-23 ENCOUNTER — Encounter (HOSPITAL_COMMUNITY)
Admission: RE | Admit: 2017-06-23 | Discharge: 2017-06-23 | Disposition: A | Discharge status: Home / Self Care | Payer: Medicare Other | Source: Ambulatory Visit | Attending: Internal Medicine | Admitting: Internal Medicine

## 2017-06-23 ENCOUNTER — Other Ambulatory Visit: Payer: Self-pay | Admitting: Internal Medicine

## 2017-06-23 NOTE — Progress Notes (Signed)
Pt here for therapeutic phlebotomy. 250 ml removed as ordered by Dr. Maudie Mercury from left antecubital area. Pt tolerated well.

## 2017-06-29 DIAGNOSIS — E119 Type 2 diabetes mellitus without complications: Secondary | ICD-10-CM | POA: Diagnosis not present

## 2017-06-29 DIAGNOSIS — M5442 Lumbago with sciatica, left side: Secondary | ICD-10-CM | POA: Diagnosis not present

## 2017-06-29 DIAGNOSIS — I10 Essential (primary) hypertension: Secondary | ICD-10-CM | POA: Diagnosis not present

## 2017-06-29 DIAGNOSIS — E7800 Pure hypercholesterolemia, unspecified: Secondary | ICD-10-CM | POA: Diagnosis not present

## 2017-06-29 DIAGNOSIS — G8929 Other chronic pain: Secondary | ICD-10-CM | POA: Diagnosis not present

## 2017-07-19 DIAGNOSIS — M545 Low back pain: Secondary | ICD-10-CM | POA: Diagnosis not present

## 2017-07-19 DIAGNOSIS — M4316 Spondylolisthesis, lumbar region: Secondary | ICD-10-CM | POA: Diagnosis not present

## 2017-07-19 DIAGNOSIS — M5136 Other intervertebral disc degeneration, lumbar region: Secondary | ICD-10-CM | POA: Diagnosis not present

## 2017-07-21 ENCOUNTER — Encounter (HOSPITAL_COMMUNITY): Payer: Medicare Other

## 2017-07-26 DIAGNOSIS — M545 Low back pain: Secondary | ICD-10-CM | POA: Diagnosis not present

## 2017-08-02 DIAGNOSIS — G629 Polyneuropathy, unspecified: Secondary | ICD-10-CM | POA: Diagnosis not present

## 2017-08-02 DIAGNOSIS — M549 Dorsalgia, unspecified: Secondary | ICD-10-CM | POA: Diagnosis not present

## 2017-08-15 DIAGNOSIS — G629 Polyneuropathy, unspecified: Secondary | ICD-10-CM | POA: Diagnosis not present

## 2017-08-18 ENCOUNTER — Other Ambulatory Visit (HOSPITAL_COMMUNITY): Payer: Self-pay | Admitting: *Deleted

## 2017-08-18 ENCOUNTER — Other Ambulatory Visit: Payer: Self-pay | Admitting: Internal Medicine

## 2017-08-19 ENCOUNTER — Other Ambulatory Visit (HOSPITAL_COMMUNITY): Payer: Self-pay | Admitting: *Deleted

## 2017-08-19 ENCOUNTER — Ambulatory Visit (HOSPITAL_COMMUNITY)
Admission: RE | Admit: 2017-08-19 | Discharge: 2017-08-19 | Disposition: A | Discharge status: Home / Self Care | Payer: Medicare Other | Source: Ambulatory Visit | Attending: Internal Medicine | Admitting: Internal Medicine

## 2017-08-19 DIAGNOSIS — R05 Cough: Secondary | ICD-10-CM | POA: Diagnosis not present

## 2017-08-19 DIAGNOSIS — J45909 Unspecified asthma, uncomplicated: Secondary | ICD-10-CM | POA: Diagnosis not present

## 2017-08-19 LAB — POCT HEMOGLOBIN-HEMACUE: Hemoglobin: 14.8 g/dL (ref 12.0–15.0)

## 2017-08-19 MED ORDER — LIDOCAINE HCL (PF) 1 % IJ SOLN
INTRAMUSCULAR | Status: AC
  Filled 2017-08-19: qty 2

## 2017-08-30 DIAGNOSIS — M5136 Other intervertebral disc degeneration, lumbar region: Secondary | ICD-10-CM | POA: Diagnosis not present

## 2017-09-26 IMAGING — US US ABDOMEN COMPLETE
1 series · 14 of 25 positions shown · non-contrast
Comparison: 05/08/2015

CLINICAL DATA: Weight loss, abnormal LFTs

EXAM:
ABDOMEN ULTRASOUND COMPLETE

[Series 1: us abdomen complete · 0.20mm/px · 14 of 80 slices shown]
[im 1/80]
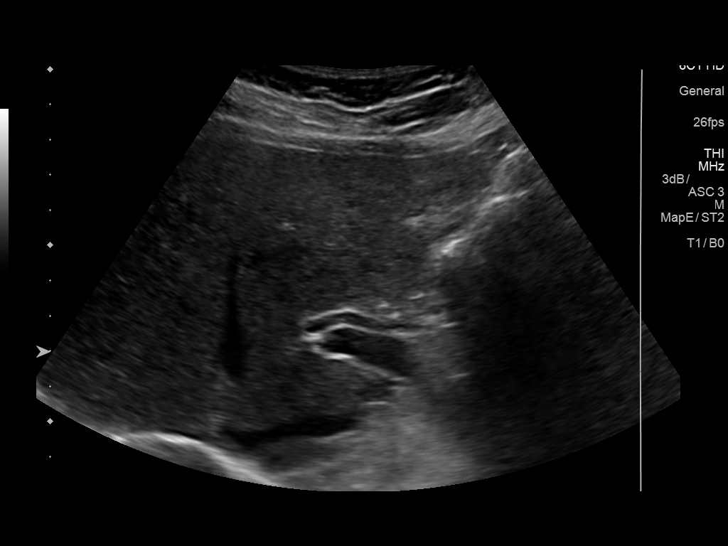
[im 7/80]
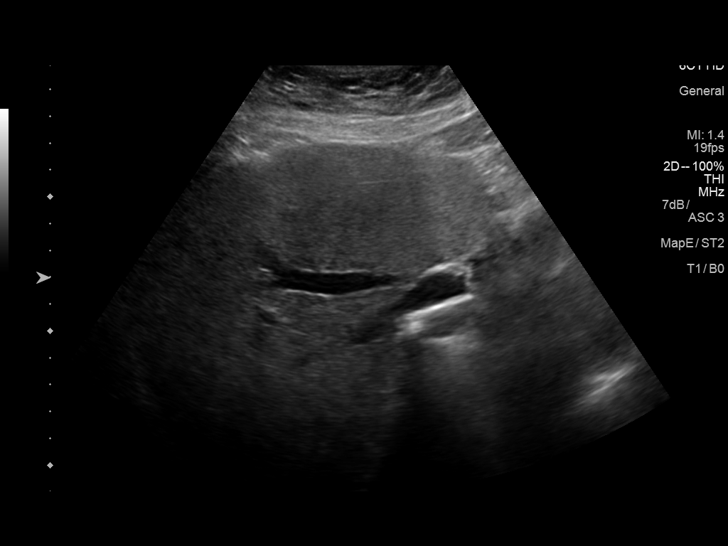
[im 14/80]
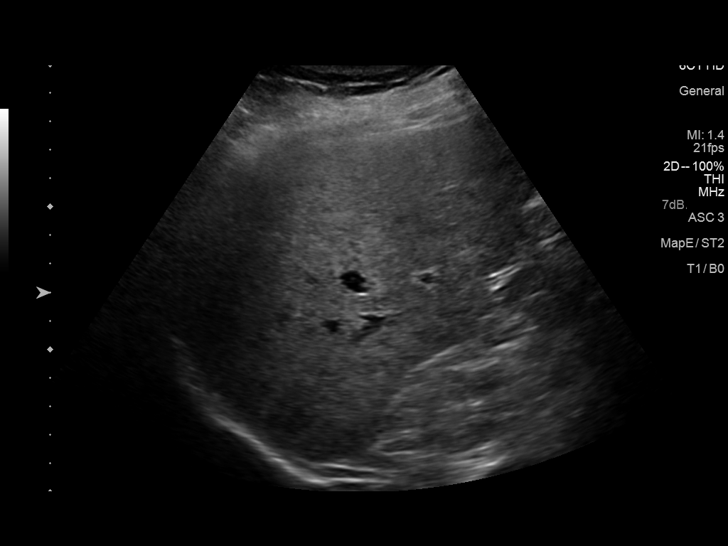
[im 20/80]
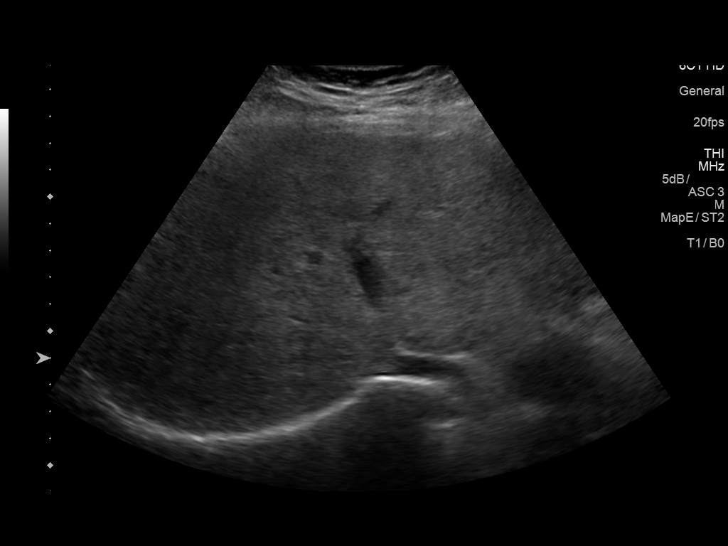
[im 27/80]
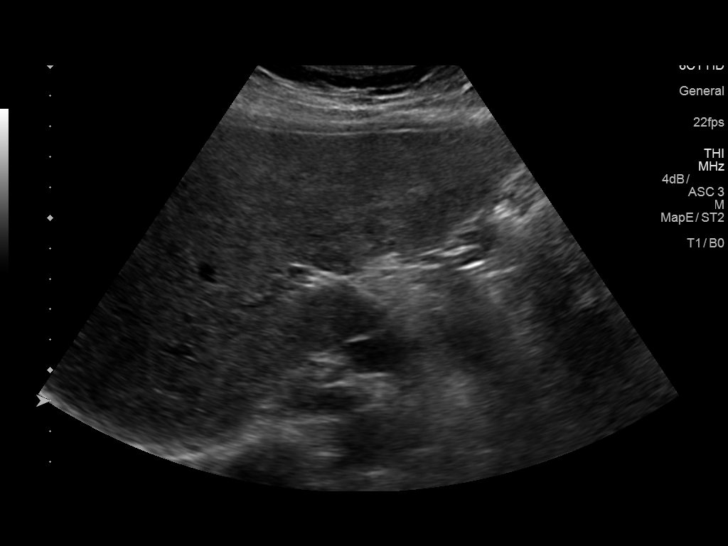
[im 30/80]
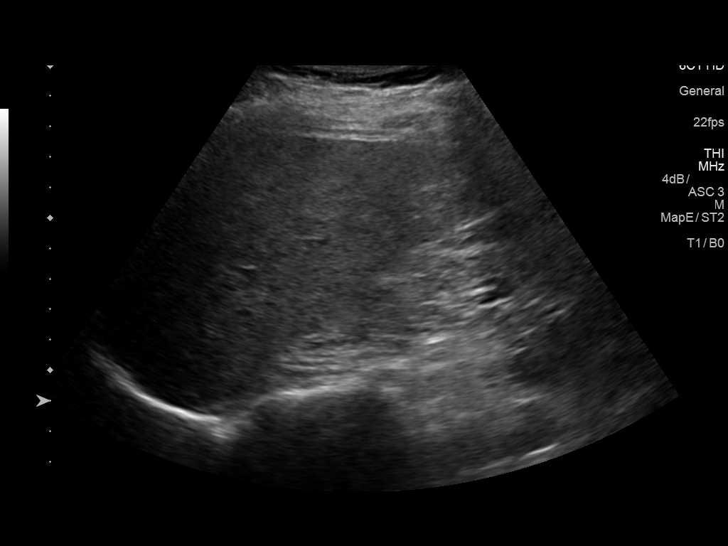
[im 37/80]
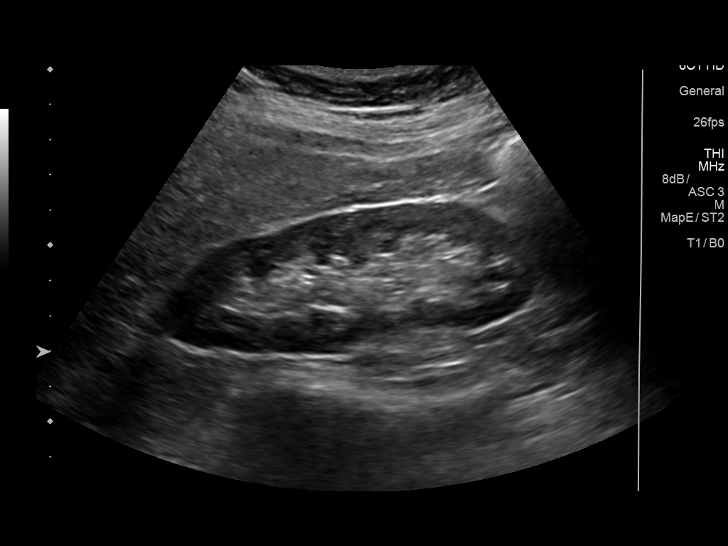
[im 43/80]
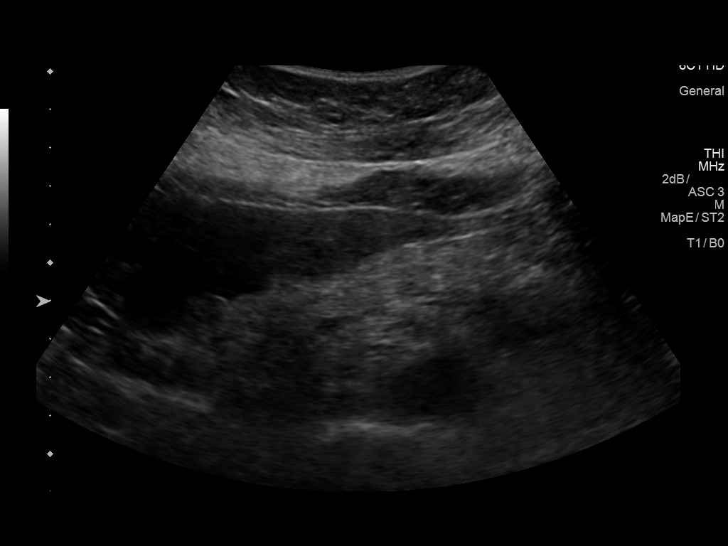
[im 50/80]
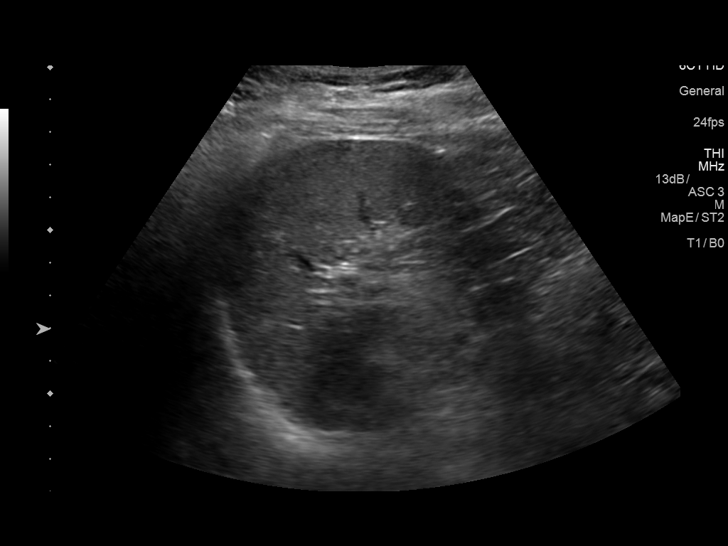
[im 53/80]
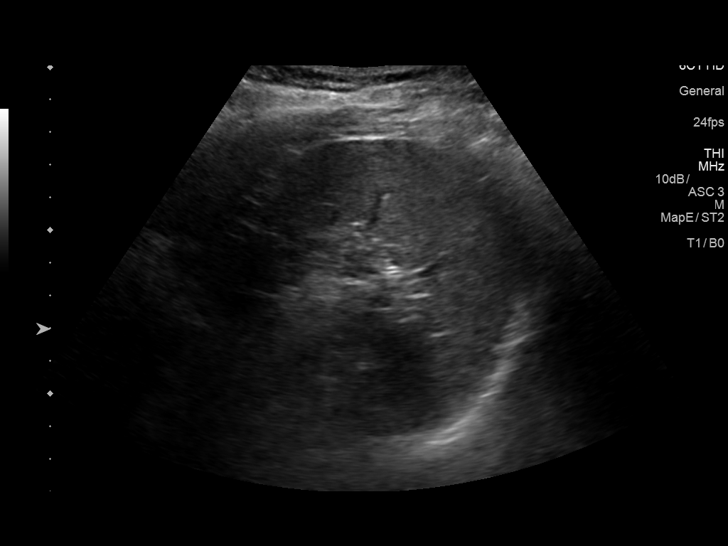
[im 60/80]
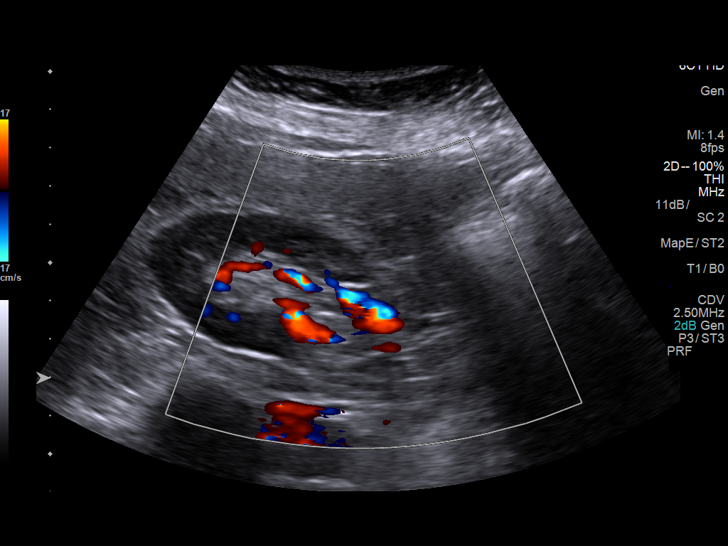
[im 66/80]
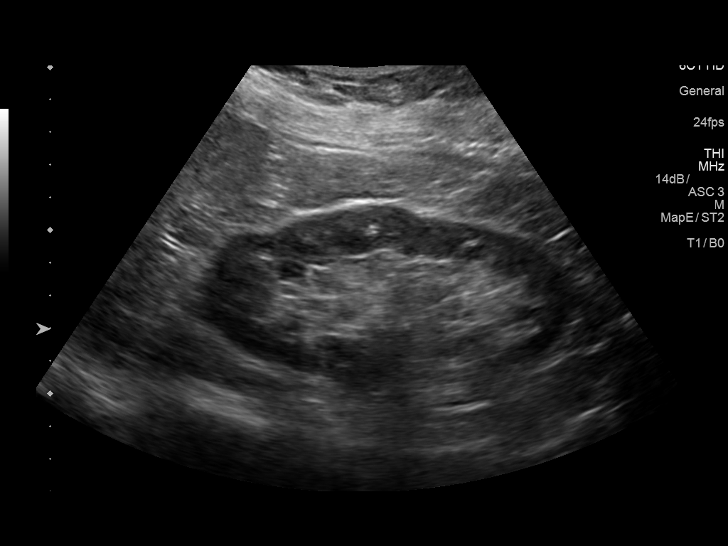
[im 73/80]
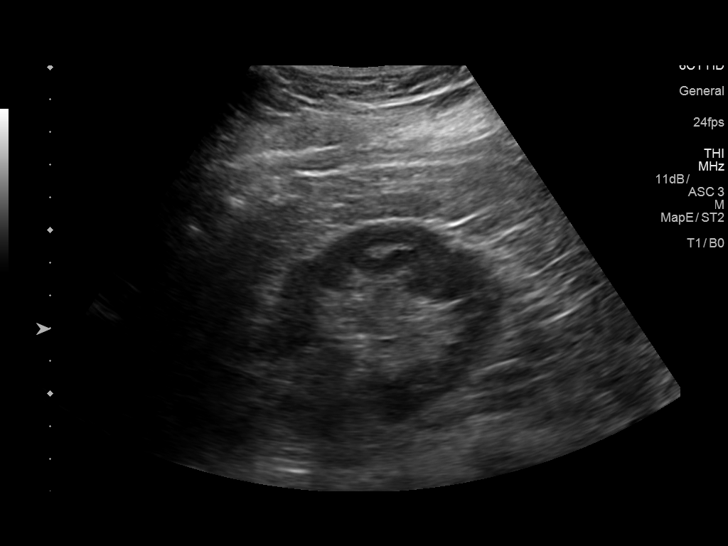
[im 80/80]
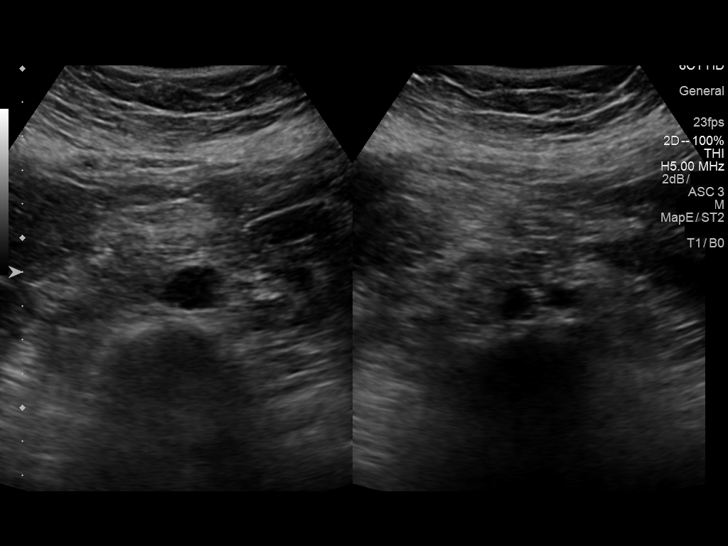

[14 of 25 positions shown; findings below may reference images not displayed]

FINDINGS: Gallbladder: Prior cholecystectomy

Common bile duct: Diameter: Normal caliber, 3 mm

Liver: Increased echotexture compatible with fatty infiltration. No
focal abnormality or biliary ductal dilatation.

IVC: No abnormality visualized.

Pancreas: Visualized portion unremarkable.

Spleen: Size and appearance within normal limits.

Right Kidney: Length: 11.0 cm. Echogenicity within normal limits. No
mass or hydronephrosis visualized.

Left Kidney: Length: 11.2 cm. Echogenicity within normal limits. No
mass or hydronephrosis visualized.

Abdominal aorta: No aneurysm visualized.

Other findings: None.
IMPRESSION: Fatty liver.

Prior cholecystectomy.

No acute findings.

## 2017-10-10 ENCOUNTER — Other Ambulatory Visit (HOSPITAL_COMMUNITY): Payer: Self-pay | Admitting: *Deleted

## 2017-10-10 ENCOUNTER — Ambulatory Visit (HOSPITAL_COMMUNITY)
Admission: RE | Admit: 2017-10-10 | Discharge: 2017-10-10 | Disposition: A | Discharge status: Home / Self Care | Payer: Medicare Other | Source: Ambulatory Visit | Attending: Internal Medicine | Admitting: Internal Medicine

## 2017-10-10 ENCOUNTER — Other Ambulatory Visit: Payer: Self-pay | Admitting: Internal Medicine

## 2017-10-10 LAB — POCT HEMOGLOBIN-HEMACUE: Hemoglobin: 16.3 g/dL — ABNORMAL HIGH (ref 12.0–15.0)

## 2017-10-10 MED ORDER — LIDOCAINE HCL (PF) 1 % IJ SOLN: 2.0000 mL | Freq: Once | INTRAMUSCULAR | Status: DC

## 2017-10-10 NOTE — Progress Notes (Signed)
Patient's Hgb 16.3.  Therapeutic phlebotomy performed via left antecubital.  276mL of blood waste removed.  Patient tolerated procedure well.  Will continue to monitor until discharge.

## 2017-10-25 DIAGNOSIS — E7800 Pure hypercholesterolemia, unspecified: Secondary | ICD-10-CM | POA: Diagnosis not present

## 2017-10-25 DIAGNOSIS — E119 Type 2 diabetes mellitus without complications: Secondary | ICD-10-CM | POA: Diagnosis not present

## 2017-10-25 DIAGNOSIS — I10 Essential (primary) hypertension: Secondary | ICD-10-CM | POA: Diagnosis not present

## 2017-11-02 DIAGNOSIS — M6740 Ganglion, unspecified site: Secondary | ICD-10-CM | POA: Diagnosis not present

## 2017-11-02 DIAGNOSIS — F988 Other specified behavioral and emotional disorders with onset usually occurring in childhood and adolescence: Secondary | ICD-10-CM | POA: Diagnosis not present

## 2017-11-02 DIAGNOSIS — E7800 Pure hypercholesterolemia, unspecified: Secondary | ICD-10-CM | POA: Diagnosis not present

## 2017-11-02 DIAGNOSIS — E119 Type 2 diabetes mellitus without complications: Secondary | ICD-10-CM | POA: Diagnosis not present

## 2017-11-02 DIAGNOSIS — L820 Inflamed seborrheic keratosis: Secondary | ICD-10-CM | POA: Diagnosis not present

## 2017-11-02 DIAGNOSIS — I10 Essential (primary) hypertension: Secondary | ICD-10-CM | POA: Diagnosis not present

## 2017-11-02 DIAGNOSIS — L821 Other seborrheic keratosis: Secondary | ICD-10-CM | POA: Diagnosis not present

## 2017-11-07 ENCOUNTER — Inpatient Hospital Stay (HOSPITAL_COMMUNITY): Admission: RE | Admit: 2017-11-07 | Payer: Medicare Other | Source: Ambulatory Visit

## 2017-12-05 ENCOUNTER — Ambulatory Visit (HOSPITAL_COMMUNITY)
Admission: RE | Admit: 2017-12-05 | Discharge: 2017-12-05 | Disposition: A | Discharge status: Home / Self Care | Payer: Medicare Other | Source: Ambulatory Visit | Attending: Internal Medicine | Admitting: Internal Medicine

## 2017-12-05 LAB — POCT HEMOGLOBIN-HEMACUE: Hemoglobin: 14.4 g/dL (ref 12.0–15.0)

## 2017-12-05 NOTE — Progress Notes (Signed)
Hgb 14.4 via hemocue.  Therapeutic phlebotomy performed via left arm without difficulty or complication.  Pt with no complaints.

## 2017-12-13 DIAGNOSIS — Z79899 Other long term (current) drug therapy: Secondary | ICD-10-CM | POA: Diagnosis not present

## 2017-12-15 DIAGNOSIS — M67432 Ganglion, left wrist: Secondary | ICD-10-CM | POA: Diagnosis not present

## 2017-12-15 DIAGNOSIS — M25532 Pain in left wrist: Secondary | ICD-10-CM | POA: Diagnosis not present

## 2018-01-02 ENCOUNTER — Encounter (HOSPITAL_COMMUNITY): Payer: Medicare Other

## 2018-01-23 DIAGNOSIS — M25532 Pain in left wrist: Secondary | ICD-10-CM | POA: Diagnosis not present

## 2018-01-26 DIAGNOSIS — E119 Type 2 diabetes mellitus without complications: Secondary | ICD-10-CM | POA: Diagnosis not present

## 2018-01-27 ENCOUNTER — Other Ambulatory Visit (HOSPITAL_COMMUNITY): Payer: Self-pay

## 2018-01-30 ENCOUNTER — Ambulatory Visit (HOSPITAL_COMMUNITY)
Admission: RE | Admit: 2018-01-30 | Discharge: 2018-01-30 | Disposition: A | Discharge status: Home / Self Care | Payer: Medicare Other | Source: Ambulatory Visit | Attending: Internal Medicine | Admitting: Internal Medicine

## 2018-01-30 LAB — POCT HEMOGLOBIN-HEMACUE: Hemoglobin: 14.9 g/dL (ref 12.0–15.0)

## 2018-03-01 DIAGNOSIS — I10 Essential (primary) hypertension: Secondary | ICD-10-CM | POA: Diagnosis not present

## 2018-03-01 DIAGNOSIS — E119 Type 2 diabetes mellitus without complications: Secondary | ICD-10-CM | POA: Diagnosis not present

## 2018-03-01 DIAGNOSIS — Z79899 Other long term (current) drug therapy: Secondary | ICD-10-CM | POA: Diagnosis not present

## 2018-03-08 DIAGNOSIS — G629 Polyneuropathy, unspecified: Secondary | ICD-10-CM | POA: Diagnosis not present

## 2018-03-08 DIAGNOSIS — R05 Cough: Secondary | ICD-10-CM | POA: Diagnosis not present

## 2018-03-08 DIAGNOSIS — J309 Allergic rhinitis, unspecified: Secondary | ICD-10-CM | POA: Diagnosis not present

## 2018-03-08 DIAGNOSIS — I10 Essential (primary) hypertension: Secondary | ICD-10-CM | POA: Diagnosis not present

## 2018-03-08 DIAGNOSIS — E119 Type 2 diabetes mellitus without complications: Secondary | ICD-10-CM | POA: Diagnosis not present

## 2018-03-08 DIAGNOSIS — E7800 Pure hypercholesterolemia, unspecified: Secondary | ICD-10-CM | POA: Diagnosis not present

## 2018-03-08 DIAGNOSIS — Z23 Encounter for immunization: Secondary | ICD-10-CM | POA: Diagnosis not present

## 2018-04-07 ENCOUNTER — Other Ambulatory Visit (HOSPITAL_COMMUNITY): Payer: Self-pay

## 2018-04-10 ENCOUNTER — Ambulatory Visit (HOSPITAL_COMMUNITY)
Admission: RE | Admit: 2018-04-10 | Discharge: 2018-04-10 | Disposition: A | Discharge status: Home / Self Care | Payer: Medicare Other | Source: Ambulatory Visit | Attending: Family Medicine | Admitting: Family Medicine

## 2018-04-10 LAB — POCT HEMOGLOBIN-HEMACUE: Hemoglobin: 13.9 g/dL (ref 12.0–15.0)

## 2018-04-28 ENCOUNTER — Encounter: Payer: Self-pay | Admitting: Allergy

## 2018-04-28 ENCOUNTER — Ambulatory Visit (INDEPENDENT_AMBULATORY_CARE_PROVIDER_SITE_OTHER): Payer: Medicare Other | Admitting: Allergy

## 2018-04-28 VITALS — BP 142/84 | HR 81 | Resp 18 | Ht 64.0 in | Wt 189.0 lb

## 2018-04-28 DIAGNOSIS — R0602 Shortness of breath: Secondary | ICD-10-CM

## 2018-04-28 DIAGNOSIS — J3089 Other allergic rhinitis: Secondary | ICD-10-CM | POA: Diagnosis not present

## 2018-04-28 DIAGNOSIS — T781XXD Other adverse food reactions, not elsewhere classified, subsequent encounter: Secondary | ICD-10-CM

## 2018-04-28 MED ORDER — FLUTICASONE PROPIONATE 50 MCG/ACT NA SUSP: 1.0000 | Freq: Every day | NASAL | 4 refills | Status: DC

## 2018-04-28 MED ORDER — AZELASTINE HCL 0.1 % NA SOLN: 1.0000 | Freq: Two times a day (BID) | NASAL | 5 refills | Status: DC | PRN

## 2018-04-28 NOTE — Patient Instructions (Addendum)
Allergies   - environmental allergy skin testing today is positive to weed pollen, tree pollen, cat, dust mite and mold  - allergen avoidance measures provided today  - continue Claritin daily as needed  - continue Azelastine nasal antihistamine 2 sprays each nostril 1-2 times a day for nasal drainage/post-nasal drip  - continue Flonase 2 sprays daily for nasal congestion.  Use for 1-2 weeks at time before stopping once symptoms improve.    - allergen immunotherapy discussed today including protocol, benefits and risk.  Informational handout provided.  If interested in this therapuetic option you can check with your insurance carrier for coverage.  Let us know if you would like to proceed with this option.    Pollen food allergy syndrome  - The oral allergy syndrome (OAS) or pollen-food allergy syndrome (PFAS) is a relatively common form of food allergy, particularly in adults. It typically occurs in people who have pollen allergies when the immune system "sees" proteins on the food that look like proteins on the pollen. This results in the allergy antibody (IgE) binding to the food instead of the pollen. Patients typically report itching and/or mild swelling of the mouth and throat immediately following ingestion of certain uncooked fruits (including nuts) or raw vegetables. Only a very small number of affected individuals experience systemic allergic reactions, such as anaphylaxis which occurs with true food allergies.    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  Would continue avoidance of fresh pineapple to avoid oral symptoms   Follow-up 3-6 months or sooner if needed

## 2018-04-28 NOTE — Progress Notes (Signed)
New Patient Note  RE: Miranda Stark MRN: [id number] DOB: 1950/08/29 Date of Office Visit: 04/28/2018  Referring provider: [name], DO Primary care provider: [name], MD  Chief Complaint: allergies  History of present illness: Miranda Stark is a 68 y.o. female presenting today for consultation for allergic rhinitis.    She states while she was in [facility] in May 2019 she states there was something blooming that smelled good but she developed allergy flare.  She states she was irritated more in her throat with throat itch, throat felt at bit swollen with mild trouble swallowing and some difficulty breathing.  She went to UC and was given zpak and steroid and dextramethorphan.    She states she has had allergies most of her life.  She has undergone allergen immunotherapy twice with 12-15 years in between.  The last time she has allergy testing was in her 22s.  She would like to repeat testing done today.    She does take claritin, flonase and azelastine that she takes year-round.  She stopped these medications about 2 weeks ago or so and does feel that she is a bit itchier currently.  She does report having symptoms of burning and itchy eyes as well as runny nose and cough.   Denies history of eczema or food allergy.  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  She denies having a history of asthma but states someone last year told her she had asthma based on her chart.  She has never required use of albuterol or breathing treatments.  She denies wheezing or chest tightness.  She does report she has some SOB after having back surgery years ago and that she may get SOB if she has to walk uphill for a period of time but symptoms subside after exertion is over.   Review of systems: Review of Systems  Constitutional: Negative for chills, fever and malaise/fatigue.  HENT: Negative for congestion, ear discharge, nosebleeds and  sore throat.   Eyes: Negative for pain, discharge and redness.  Respiratory: Negative for cough, shortness of breath and wheezing.   Cardiovascular: Negative for chest pain.  Gastrointestinal: Negative for abdominal pain, constipation, diarrhea, heartburn, nausea and vomiting.  Musculoskeletal: Negative for joint pain.  Skin: Positive for itching. Negative for rash.  Neurological: Negative for headaches.    All other systems negative unless noted above in HPI  Past medical history: Past Medical History:  Diagnosis Date  . Allergic rhinitis   . Anemia    prior to  hysterectomy  . Anxiety    takes lorazepam for  prn anxiety  . Cardiac arrhythmia due to congenital heart disease    pt denies  . Complication of anesthesia   . Depression    history of  depression while caring for ill mother  . Eczema   . Fatty liver   . Fibromyalgia   . Gallstones   . GERD (gastroesophageal reflux disease)   . H/O colonoscopy with polypectomy   . H/O seasonal allergies   . Hemochromatosis    compound heterozygous  . Hepatitis    drug induced elevated liver enzymes  . History of recurrent UTI (urinary tract infection)    due to urethral stricture;; s/p dilatation 25 years ago  . HTN (hypertension)   . Insomnia   . Obesity   . Ovarian cyst   . PONV (postoperative nausea and vomiting)   .  Sciatica     Past surgical history: Past Surgical History:  Procedure Laterality Date  . APPENDECTOMY  1994  . CESAREAN SECTION  1983  . COLONOSCOPY  02/03/11   diminutive adenoma and hemorrhoids  . EXPLORATORY LAPAROTOMY   07/15/2010     Lysis of adhesions, Bilateral salpingo-oophorectomy.   Marland Kitchen LAPAROSCOPIC CHOLECYSTECTOMY  03/17/00   with intraoperative cholangiogram.  . LAPAROSCOPIC OOPHORECTOMY    . LAPAROSCOPY  1982, 1990  . LUMBAR LAMINECTOMY/DECOMPRESSION MICRODISCECTOMY Right 08/15/2014   Procedure: LUMBAR LAMINECTOMY/DECOMPRESSION MICRODISCECTOMY RIGHT LUMBAR FOUR-FIVE AND ,EXTRAFORAMINAL  EXPLORATION;  Surgeon: Jovita Gamma, MD;  Location: [facility];  Service: Neurosurgery;  Laterality: Right;  . TONSILLECTOMY AND ADENOIDECTOMY  1965  . UPPER GASTROINTESTINAL ENDOSCOPY  02/03/11   53 [address] Maloney dilation (looked normal but dysphagia)  . WISDOM TOOTH EXTRACTION  1972    Family history:  Family History  Problem Relation Age of Onset  . Diabetes Mother        sister  . Hypertension Mother        sister  . Lung disease Mother   . Osteoporosis Mother        brother  . Basal cell carcinoma Mother   . Mental illness Mother   . Kidney failure Brother   . Sleep apnea Sister        mother  . Colon cancer Neg Hx     Social history: Lives in a home with carpeting with electric heating and central cooling.  There are no pets in the home.  She occasionally visits her son's home where there is a dog.  She is retired and she does Psychologist, occupational work at the [employer].  She is retired from Building surveyor in 2009. Tobacco Use  . Smoking status: Former Smoker    Packs/day: 0.20    Years: 8.00    Pack years: 1.60    Types: Cigarettes    Last attempt to quit: 03/29/2012    Years since quitting: 8.0  . Smokeless tobacco: Never Used    Medication List: Allergies as of 04/28/2018      Reactions   Actifed Cold-allergy [chlorpheniramine-phenyleph Er] Other (See Comments)   tachycardia   Ampicillin Other (See Comments)   Causes yeast   Beef-derived Products Other (See Comments)   Mood swings   Benadryl [diphenhydramine] Other (See Comments)   Restless legs   Darvocet [propoxyphene N-acetaminophen] Other (See Comments)   Floating sensation   Darvon Compound Other (See Comments)   Floating sensation   Indomethacin Other (See Comments)   Diarrhea, burning sensation lower abd   Tofranil-pm Other (See Comments)   Drug educed hepatitis   Ultram [tramadol Hcl] Other (See Comments)   Severe syncope      Medication List       Accurate as of April 28, 2018  3:48 PM. Always  use your most recent med list.        amphetamine-dextroamphetamine 5 MG tablet Commonly known as:  ADDERALL Take 5 mg by mouth as needed.   azelastine 0.1 % nasal spray Commonly known as:  ASTELIN Place 1 spray into both nostrils 2 (two) times daily as needed for rhinitis or allergies.   Citicoline Sodium 500 MG Tabs Take 300 mg by mouth daily.   cyclobenzaprine 10 MG tablet Commonly known as:  FLEXERIL Take 10 mg by mouth at bedtime as needed (fibromyalgia).   fluticasone 50 MCG/ACT nasal spray Commonly known as:  FLONASE Place into both nostrils daily.  gabapentin 300 MG capsule Commonly known as:  NEURONTIN Take 300 mg by mouth 2 (two) times daily.   HYDROcodone-acetaminophen 5-325 MG tablet Commonly known as:  NORCO/VICODIN Take 1-2 tablets by mouth every 4 (four) hours as needed (mild pain).   loratadine 10 MG tablet Commonly known as:  CLARITIN Take 10 mg by mouth daily as needed for allergies.   LORazepam 1 MG tablet Commonly known as:  ATIVAN Take 1 mg by mouth at bedtime as needed for sleep.   losartan 100 MG tablet Commonly known as:  COZAAR Take 50 mg by mouth daily after supper.   LUTEIN-ZEAXANTHIN PO lutein-zeaxanthin   Magnesium 500 MG Tabs Take 500 mg by mouth every other day.   Melatonin 10 MG Tabs Take 1 tablet by mouth at bedtime as needed.   metFORMIN 500 MG tablet Commonly known as:  GLUCOPHAGE Take 500 mg by mouth daily.   TUMS E-X 750 PO Take 4 capsules by mouth daily.   VITAMIN D3 PO Vitamin D3 50 mcg (2,000 unit) capsule   1 capsule every day by oral route.   VITAMIN E PO Take by mouth.   ZINC PO Take by mouth.       Known medication allergies: Allergies  Allergen Reactions  . Actifed Cold-Allergy [Chlorpheniramine-Phenyleph Er] Other (See Comments)    tachycardia  . Ampicillin Other (See Comments)    Causes yeast  . Beef-Derived Products Other (See Comments)    Mood swings  . Benadryl [Diphenhydramine] Other  (See Comments)    Restless legs  . Darvocet [Propoxyphene N-Acetaminophen] Other (See Comments)    Floating sensation  . Darvon Compound Other (See Comments)    Floating sensation  . Indomethacin Other (See Comments)    Diarrhea, burning sensation lower abd  . Tofranil-Pm Other (See Comments)    Drug educed hepatitis  . Ultram [Tramadol Hcl] Other (See Comments)    Severe syncope     Physical examination: Blood pressure (!) 142/84, pulse 81, resp. rate 18, height 5\' 4"  (1.626 m), weight 189 lb (85.7 kg), SpO2 96 %.  General: Alert, interactive, in no acute distress. HEENT: PERRLA, TMs pearly gray, turbinates mildly edematous without discharge, post-pharynx non erythematous. Neck: Supple without lymphadenopathy. Lungs: Clear to auscultation without wheezing, rhonchi or rales. {no increased work of breathing. CV: Normal S1, S2 without murmurs. Abdomen: Nondistended, nontender. Skin: Warm and dry, without lesions or rashes. Extremities:  No clubbing, cyanosis or edema. Neuro:   Grossly intact.  Diagnositics/Labs:  Spirometry: FEV1: 2.19L 93%, FVC: 2.57L 83%, ratio consistent with nonobstructive pattern  Allergy testing: Environmental allergy skin prick testing is positive to burweed marshelder, d. pter Pineapple skin prick is negative Intradermal testing is positive to tree mix, mold mix 4, cat Allergy testing results were read and interpreted by provider, documented by clinical staff.   Assessment and plan:   Allergic rhinitis   - environmental allergy skin testing today is positive to weed pollen, tree pollen, cat, dust mite and mold  - allergen avoidance measures provided today  - continue Claritin daily as needed  - continue Azelastine nasal antihistamine 2 sprays each nostril 1-2 times a day for nasal drainage/post-nasal drip  - continue Flonase 2 sprays daily for nasal congestion.  Use for 1-2 weeks at time before stopping once symptoms improve.    - allergen  immunotherapy discussed today including protocol, benefits and risk.  Informational handout provided.  If interested in this therapuetic option you can check with your insurance carrier for  coverage.  Let us know if you would like to proceed with this option.    Pollen food allergy syndrome  - The oral allergy syndrome (OAS) or pollen-food allergy syndrome (PFAS) is a relatively common form of food allergy, particularly in adults. It typically occurs in people who have pollen allergies when the immune system "sees" proteins on the food that look like proteins on the pollen. This results in the allergy antibody (IgE) binding to the food instead of the pollen. Patients typically report itching and/or mild swelling of the mouth and throat immediately following ingestion of certain uncooked fruits (including nuts) or raw vegetables. Only a very small number of affected individuals experience systemic allergic reactions, such as anaphylaxis which occurs with true food allergies.    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  Would continue avoidance of fresh pineapple to avoid oral symptoms  Dyspnea with exertion  - I do not believe she has asthma based on history and normal spirometry today.    - symptoms seem most consistent with deconditioning.  She has never required use of albuterol or nebulizer treatments or even steroids.    Follow-up 3-6 months or sooner if needed  I appreciate the opportunity to take part in Mariska's care. Please do not hesitate to contact me with questions.  Sincerely,   Prudy Feeler, MD Allergy/Immunology [facility]

## 2018-06-02 ENCOUNTER — Other Ambulatory Visit (HOSPITAL_COMMUNITY): Payer: Self-pay | Admitting: *Deleted

## 2018-06-05 ENCOUNTER — Other Ambulatory Visit: Payer: Self-pay

## 2018-06-05 ENCOUNTER — Ambulatory Visit (HOSPITAL_COMMUNITY)
Admission: RE | Admit: 2018-06-05 | Discharge: 2018-06-05 | Disposition: A | Discharge status: Home / Self Care | Payer: Medicare Other | Source: Ambulatory Visit | Attending: Family Medicine | Admitting: Family Medicine

## 2018-06-05 LAB — POCT HEMOGLOBIN-HEMACUE: Hemoglobin: 15.2 g/dL — ABNORMAL HIGH (ref 12.0–15.0)

## 2018-06-05 NOTE — Progress Notes (Addendum)
Hgb via hemocue 15.2.  Removed 500cc blood via left AC per orders.  Pt tolerated well

## 2018-07-04 DIAGNOSIS — E119 Type 2 diabetes mellitus without complications: Secondary | ICD-10-CM | POA: Diagnosis not present

## 2018-07-04 DIAGNOSIS — E7800 Pure hypercholesterolemia, unspecified: Secondary | ICD-10-CM | POA: Diagnosis not present

## 2018-07-04 DIAGNOSIS — I10 Essential (primary) hypertension: Secondary | ICD-10-CM | POA: Diagnosis not present

## 2018-07-11 DIAGNOSIS — G629 Polyneuropathy, unspecified: Secondary | ICD-10-CM | POA: Diagnosis not present

## 2018-07-11 DIAGNOSIS — F988 Other specified behavioral and emotional disorders with onset usually occurring in childhood and adolescence: Secondary | ICD-10-CM | POA: Diagnosis not present

## 2018-07-11 DIAGNOSIS — E119 Type 2 diabetes mellitus without complications: Secondary | ICD-10-CM | POA: Diagnosis not present

## 2018-07-11 DIAGNOSIS — E7800 Pure hypercholesterolemia, unspecified: Secondary | ICD-10-CM | POA: Diagnosis not present

## 2018-07-11 DIAGNOSIS — I10 Essential (primary) hypertension: Secondary | ICD-10-CM | POA: Diagnosis not present

## 2018-07-11 DIAGNOSIS — J45909 Unspecified asthma, uncomplicated: Secondary | ICD-10-CM | POA: Diagnosis not present

## 2018-08-07 ENCOUNTER — Encounter (HOSPITAL_COMMUNITY): Payer: Medicare Other

## 2018-08-22 DIAGNOSIS — Z1231 Encounter for screening mammogram for malignant neoplasm of breast: Secondary | ICD-10-CM | POA: Diagnosis not present

## 2018-08-28 DIAGNOSIS — M67432 Ganglion, left wrist: Secondary | ICD-10-CM | POA: Diagnosis not present

## 2018-08-28 DIAGNOSIS — M25532 Pain in left wrist: Secondary | ICD-10-CM | POA: Diagnosis not present

## 2018-08-28 DIAGNOSIS — M65332 Trigger finger, left middle finger: Secondary | ICD-10-CM | POA: Diagnosis not present

## 2018-08-29 ENCOUNTER — Other Ambulatory Visit (HOSPITAL_COMMUNITY): Payer: Self-pay | Admitting: *Deleted

## 2018-08-30 ENCOUNTER — Other Ambulatory Visit: Payer: Self-pay

## 2018-08-30 ENCOUNTER — Ambulatory Visit (HOSPITAL_COMMUNITY)
Admission: RE | Admit: 2018-08-30 | Discharge: 2018-08-30 | Disposition: A | Discharge status: Home / Self Care | Payer: Medicare Other | Source: Ambulatory Visit | Attending: Family Medicine | Admitting: Family Medicine

## 2018-08-30 LAB — POCT HEMOGLOBIN-HEMACUE: Hemoglobin: 13.1 g/dL (ref 12.0–15.0)

## 2018-08-31 ENCOUNTER — Ambulatory Visit: Payer: Medicare Other | Admitting: Allergy

## 2018-09-01 ENCOUNTER — Other Ambulatory Visit: Payer: Self-pay

## 2018-09-01 ENCOUNTER — Ambulatory Visit (INDEPENDENT_AMBULATORY_CARE_PROVIDER_SITE_OTHER): Payer: Medicare Other | Admitting: Allergy

## 2018-09-01 ENCOUNTER — Encounter: Payer: Self-pay | Admitting: Allergy

## 2018-09-01 DIAGNOSIS — J3089 Other allergic rhinitis: Secondary | ICD-10-CM

## 2018-09-01 DIAGNOSIS — T781XXD Other adverse food reactions, not elsewhere classified, subsequent encounter: Secondary | ICD-10-CM

## 2018-09-01 MED ORDER — EPINEPHRINE 0.3 MG/0.3ML IJ SOAJ: INTRAMUSCULAR | 1 refills | Status: DC

## 2018-09-01 NOTE — Progress Notes (Signed)
RE: Miranda Stark MRN: [id number] DOB: 28-Dec-1950 Date of Telemedicine Visit: 09/01/2018  Referring provider: [name], MD Primary care provider: [name], MD  Chief Complaint: Allergic Rhinitis    Telemedicine Follow Up Visit via Telephone: I connected with Miranda Stark for a follow up on 09/02/18 by telephone and verified that I am speaking with the correct person using two identifiers.   I discussed the limitations, risks, security and privacy concerns of performing an evaluation and management service by telephone and the availability of in person appointments. I also discussed with the patient that there may be a patient responsible charge related to this service. The patient expressed understanding and agreed to proceed.  Patient is at home.  Provider is at the office.  Visit start time: 1034 Visit end time: 1115 Insurance consent/check in by: Miranda Stark  Medical consent and medical assistant/nurse: Miranda Stark  History of Present Illness: She is a 68 y.o. female, who is being followed for allergic rhinitis and OAS. Her previous allergy office visit was on 04/28/2018 with Miranda Stark.   She states after last visit use of claritin, azelastine and nasacort were helpful in controlling her allergy symptoms but since pollen season she feels this regimen is becoming less effective.  She states she has tried other long-acting antihistamines in the past and claritin was most effective.  She also has tried other nasal sprays including flonase and feels also not any different.  She is interested in allergen immunotherapy at this time.  She has done immunotherapy on 2 other occasions in the past and states they were effective and she noted less sinus infections as well when on immunotherapy.  Continues to avoid pineapple.   Assessment and Plan: Miranda Stark is a 68 y.o. female with:   Allergic rhinitis  - continue avoidance measures for weed pollen, tree pollen, cat, dust mite and mold  - continue  Claritin daily as needed  - continue Azelastine nasal antihistamine 2 sprays each nostril 1-2 times a day for nasal drainage/post-nasal drip  - continue nasal steroid spray like Nasacort or Flonase 2 sprays daily for nasal congestion.  Use as needed for 1-2 weeks at time before stopping once symptoms improve.    - allergen immunotherapy (allergy shots) benefits/risks and protocol have been discussed.  Will proceed with immunotherapy course at this time.  Will prescribe Epipen to bring on days of allergy shots.  Have discussed protocol in place currently to decrease patient to patient exposure in the times of COVID.    Pollen food allergy syndrome  - The oral allergy syndrome (OAS) or pollen-food allergy syndrome (PFAS) is a relatively common form of food allergy, particularly in adults. It typically occurs in people who have pollen allergies when the immune system "sees" proteins on the food that look like proteins on the pollen. This results in the allergy antibody (IgE) binding to the food instead of the pollen. Patients typically report itching and/or mild swelling of the mouth and throat immediately following ingestion of certain uncooked fruits (including nuts) or raw vegetables. Only a very small number of affected individuals experience systemic allergic reactions, such as anaphylaxis which occurs with true food allergies.    - pineapple skin testing was negative thus PFAS is mostly likely.  Would continue avoidance of fresh pineapple to avoid oral symptoms  Follow-up 6 months or sooner if needed  Diagnostics: None.  Medication List:  Current Outpatient Medications  Medication Sig Dispense Refill  . amphetamine-dextroamphetamine (ADDERALL) 5 MG  tablet Take 5 mg by mouth as needed.     Marland Kitchen azelastine (ASTELIN) 0.1 % nasal spray Place 1 spray into both nostrils 2 (two) times daily as needed for rhinitis or allergies. 30 mL 5  . Calcium Carbonate Antacid (TUMS E-X 750 PO) Take 4 capsules by  mouth daily.    . Cholecalciferol (VITAMIN D3 PO) Vitamin D3 50 mcg (2,000 unit) capsule   1 capsule every day by oral route.    . cromolyn (NASALCROM) 5.2 MG/ACT nasal spray Place 1 spray into both nostrils 4 (four) times daily.    . cyclobenzaprine (FLEXERIL) 10 MG tablet Take 10 mg by mouth at bedtime as needed (fibromyalgia).     . fluticasone (FLONASE) 50 MCG/ACT nasal spray Place 1 spray into both nostrils daily. 16 g 4  . gabapentin (NEURONTIN) 300 MG capsule Take 300 mg by mouth 2 (two) times daily.    Marland Kitchen HYDROcodone-acetaminophen (NORCO/VICODIN) 5-325 MG per tablet Take 1-2 tablets by mouth every 4 (four) hours as needed (mild pain). 60 tablet 0  . loratadine (CLARITIN) 10 MG tablet Take 10 mg by mouth daily as needed for allergies.    Marland Kitchen LORazepam (ATIVAN) 1 MG tablet Take 1 mg by mouth at bedtime as needed for sleep.     Marland Kitchen losartan (COZAAR) 100 MG tablet Take 50 mg by mouth daily after supper.   6  . Magnesium 500 MG TABS Take 500 mg by mouth every other day.    . Melatonin 10 MG TABS Take 1 tablet by mouth at bedtime as needed.    . metFORMIN (GLUCOPHAGE) 500 MG tablet Take 500 mg by mouth daily.    Marland Kitchen VITAMIN E PO Take by mouth.    . EPINEPHrine 0.3 mg/0.3 mL IJ SOAJ injection Use for life threatening allergic reactions 1 Device 1   No current facility-administered medications for this visit.    Allergies: Allergies  Allergen Reactions  . Actifed Cold-Allergy [Chlorpheniramine-Phenyleph Er] Other (See Comments)    tachycardia  . Ampicillin Other (See Comments)    Causes yeast  . Beef-Derived Products Other (See Comments)    Mood swings  . Benadryl [Diphenhydramine] Other (See Comments)    Restless legs  . Darvocet [Propoxyphene N-Acetaminophen] Other (See Comments)    Floating sensation  . Darvon Compound Other (See Comments)    Floating sensation  . Indomethacin Other (See Comments)    Diarrhea, burning sensation lower abd  . Tofranil-Pm Other (See Comments)    Drug  educed hepatitis  . Ultram [Tramadol Hcl] Other (See Comments)    Severe syncope   I reviewed her past medical history, social history, family history, and environmental history and no significant changes have been reported from previous visit on 04/28/2018.  Review of Systems  Constitutional: Negative for chills and fever.  HENT: Positive for congestion, postnasal drip, rhinorrhea and sneezing.   Eyes: Negative for discharge, redness and itching.  Respiratory: Negative for cough, chest tightness, shortness of breath and wheezing.   Cardiovascular: Negative.   Gastrointestinal: Negative.   Skin: Negative for rash.  Allergic/Immunologic: Positive for environmental allergies.  Neurological: Negative for headaches.   Objective: Physical Exam Not obtained as encounter was done via telephone.   Previous notes and tests were reviewed.  I discussed the assessment and treatment plan with the patient. The patient was provided an opportunity to ask questions and all were answered. The patient agreed with the plan and demonstrated an understanding of the instructions.   The patient  was advised to call back or seek an in-person evaluation if the symptoms worsen or if the condition fails to improve as anticipated.  I provided 44 minutes of non-face-to-face time during this encounter.  It was my pleasure to participate in [facility] care today. Please feel free to contact me with any questions or concerns.   Sincerely,  Monae Topping Charmian Muff, MD

## 2018-09-02 NOTE — Patient Instructions (Signed)
Allergic rhinitis  - continue avoidance measures for weed pollen, tree pollen, cat, dust mite and mold  - continue Claritin daily as needed  - continue Azelastine nasal antihistamine 2 sprays each nostril 1-2 times a day for nasal drainage/post-nasal drip  - continue nasal steroid spray like Nasacort or Flonase 2 sprays daily for nasal congestion.  Use as needed for 1-2 weeks at time before stopping once symptoms improve.    - allergen immunotherapy (allergy shots) benefits/risks and protocol have been discussed.  Will proceed with immunotherapy course at this time.  Will prescribe Epipen to bring on days of allergy shots.  Have discussed protocol in place currently to decrease patient to patient exposure in the times of COVID.    Pollen food allergy syndrome  - The oral allergy syndrome (OAS) or pollen-food allergy syndrome (PFAS) is a relatively common form of food allergy, particularly in adults. It typically occurs in people who have pollen allergies when the immune system "sees" proteins on the food that look like proteins on the pollen. This results in the allergy antibody (IgE) binding to the food instead of the pollen. Patients typically report itching and/or mild swelling of the mouth and throat immediately following ingestion of certain uncooked fruits (including nuts) or raw vegetables. Only a very small number of affected individuals experience systemic allergic reactions, such as anaphylaxis which occurs with true food allergies.    - pineapple skin testing was negative thus PFAS is mostly likely.  Would continue avoidance of fresh pineapple to avoid oral symptoms   Follow-up 6 months or sooner if needed

## 2018-09-04 ENCOUNTER — Encounter (HOSPITAL_COMMUNITY): Payer: Medicare Other

## 2018-09-04 ENCOUNTER — Telehealth: Payer: Self-pay | Admitting: *Deleted

## 2018-09-04 DIAGNOSIS — J301 Allergic rhinitis due to pollen: Secondary | ICD-10-CM | POA: Diagnosis not present

## 2018-09-04 NOTE — Telephone Encounter (Signed)
Received a PA for Epipen Brand name called [facility] to see if they can run generic for patient's insurance. Colletta Maryland at [place] states they cannot get generic in stock and patient was notified of this and for her to go to different pharmacy to get this Rx filled.

## 2018-09-04 NOTE — Progress Notes (Signed)
VIALS EXP 09-04-2019 

## 2018-09-05 DIAGNOSIS — J3089 Other allergic rhinitis: Secondary | ICD-10-CM | POA: Diagnosis not present

## 2018-09-20 ENCOUNTER — Other Ambulatory Visit: Payer: Self-pay

## 2018-09-20 ENCOUNTER — Ambulatory Visit (INDEPENDENT_AMBULATORY_CARE_PROVIDER_SITE_OTHER): Payer: Medicare Other | Admitting: *Deleted

## 2018-09-20 DIAGNOSIS — J309 Allergic rhinitis, unspecified: Secondary | ICD-10-CM

## 2018-09-20 NOTE — Progress Notes (Signed)
Immunotherapy   Patient Details  Name: Miranda Stark MRN: [id number] Date of Birth: 02-16-1951  09/20/2018  Dorene Grebe Pelley started injections for  Pollen, Mite-Mold Following schedule: B  Frequency:1 time per week Epi-Pen: Yes Consent signed and patient instructions given. Patient received .15ml of Pollen in the RUA and .23ml of Mite-Mold in the Peter. Patient waited 30 minutes and did not experience any issues.    Ashleigh Fernandez-Vernon 09/20/2018, 3:15 PM

## 2018-09-27 ENCOUNTER — Ambulatory Visit (INDEPENDENT_AMBULATORY_CARE_PROVIDER_SITE_OTHER): Payer: Medicare Other | Admitting: *Deleted

## 2018-09-27 DIAGNOSIS — J309 Allergic rhinitis, unspecified: Secondary | ICD-10-CM | POA: Diagnosis not present

## 2018-10-03 ENCOUNTER — Ambulatory Visit (INDEPENDENT_AMBULATORY_CARE_PROVIDER_SITE_OTHER): Payer: Medicare Other | Admitting: *Deleted

## 2018-10-03 DIAGNOSIS — J309 Allergic rhinitis, unspecified: Secondary | ICD-10-CM

## 2018-10-11 ENCOUNTER — Ambulatory Visit (INDEPENDENT_AMBULATORY_CARE_PROVIDER_SITE_OTHER): Payer: Medicare Other | Admitting: *Deleted

## 2018-10-11 DIAGNOSIS — J309 Allergic rhinitis, unspecified: Secondary | ICD-10-CM

## 2018-10-18 ENCOUNTER — Ambulatory Visit (INDEPENDENT_AMBULATORY_CARE_PROVIDER_SITE_OTHER): Payer: Medicare Other | Admitting: *Deleted

## 2018-10-18 DIAGNOSIS — J309 Allergic rhinitis, unspecified: Secondary | ICD-10-CM | POA: Diagnosis not present

## 2018-10-25 ENCOUNTER — Ambulatory Visit (INDEPENDENT_AMBULATORY_CARE_PROVIDER_SITE_OTHER): Payer: Medicare Other

## 2018-10-25 DIAGNOSIS — J309 Allergic rhinitis, unspecified: Secondary | ICD-10-CM

## 2018-10-31 ENCOUNTER — Ambulatory Visit (INDEPENDENT_AMBULATORY_CARE_PROVIDER_SITE_OTHER): Payer: Medicare Other | Admitting: *Deleted

## 2018-10-31 DIAGNOSIS — J309 Allergic rhinitis, unspecified: Secondary | ICD-10-CM | POA: Diagnosis not present

## 2018-11-03 DIAGNOSIS — E7800 Pure hypercholesterolemia, unspecified: Secondary | ICD-10-CM | POA: Diagnosis not present

## 2018-11-03 DIAGNOSIS — E119 Type 2 diabetes mellitus without complications: Secondary | ICD-10-CM | POA: Diagnosis not present

## 2018-11-03 DIAGNOSIS — I10 Essential (primary) hypertension: Secondary | ICD-10-CM | POA: Diagnosis not present

## 2018-11-10 ENCOUNTER — Ambulatory Visit (INDEPENDENT_AMBULATORY_CARE_PROVIDER_SITE_OTHER): Payer: Medicare Other | Admitting: *Deleted

## 2018-11-10 DIAGNOSIS — J309 Allergic rhinitis, unspecified: Secondary | ICD-10-CM | POA: Diagnosis not present

## 2018-11-10 DIAGNOSIS — F988 Other specified behavioral and emotional disorders with onset usually occurring in childhood and adolescence: Secondary | ICD-10-CM | POA: Diagnosis not present

## 2018-11-10 DIAGNOSIS — E119 Type 2 diabetes mellitus without complications: Secondary | ICD-10-CM | POA: Diagnosis not present

## 2018-11-10 DIAGNOSIS — G629 Polyneuropathy, unspecified: Secondary | ICD-10-CM | POA: Diagnosis not present

## 2018-11-14 ENCOUNTER — Ambulatory Visit (INDEPENDENT_AMBULATORY_CARE_PROVIDER_SITE_OTHER): Payer: Medicare Other | Admitting: *Deleted

## 2018-11-14 DIAGNOSIS — J309 Allergic rhinitis, unspecified: Secondary | ICD-10-CM | POA: Diagnosis not present

## 2018-12-07 ENCOUNTER — Ambulatory Visit (INDEPENDENT_AMBULATORY_CARE_PROVIDER_SITE_OTHER): Payer: Medicare Other | Admitting: *Deleted

## 2018-12-07 DIAGNOSIS — J309 Allergic rhinitis, unspecified: Secondary | ICD-10-CM | POA: Diagnosis not present

## 2018-12-07 DIAGNOSIS — M67432 Ganglion, left wrist: Secondary | ICD-10-CM | POA: Diagnosis not present

## 2018-12-07 DIAGNOSIS — M65332 Trigger finger, left middle finger: Secondary | ICD-10-CM | POA: Diagnosis not present

## 2018-12-11 ENCOUNTER — Ambulatory Visit (HOSPITAL_COMMUNITY)
Admission: RE | Admit: 2018-12-11 | Discharge: 2018-12-11 | Disposition: A | Discharge status: Home / Self Care | Payer: Medicare Other | Source: Ambulatory Visit | Attending: Family Medicine | Admitting: Family Medicine

## 2018-12-11 ENCOUNTER — Other Ambulatory Visit: Payer: Self-pay

## 2018-12-11 LAB — POCT HEMOGLOBIN-HEMACUE: Hemoglobin: 14 g/dL (ref 12.0–15.0)

## 2018-12-11 NOTE — Progress Notes (Signed)
Pt here for therapeutic phlebotomy.  315 cc blood obtained before flow stopped. Pt preferred not to be stuck again.  Office notified.

## 2018-12-13 ENCOUNTER — Ambulatory Visit (INDEPENDENT_AMBULATORY_CARE_PROVIDER_SITE_OTHER): Payer: Medicare Other | Admitting: *Deleted

## 2018-12-13 DIAGNOSIS — J309 Allergic rhinitis, unspecified: Secondary | ICD-10-CM | POA: Diagnosis not present

## 2018-12-19 ENCOUNTER — Ambulatory Visit (INDEPENDENT_AMBULATORY_CARE_PROVIDER_SITE_OTHER): Payer: Medicare Other | Admitting: *Deleted

## 2018-12-19 DIAGNOSIS — J309 Allergic rhinitis, unspecified: Secondary | ICD-10-CM

## 2018-12-22 DIAGNOSIS — Z23 Encounter for immunization: Secondary | ICD-10-CM | POA: Diagnosis not present

## 2018-12-26 ENCOUNTER — Ambulatory Visit (INDEPENDENT_AMBULATORY_CARE_PROVIDER_SITE_OTHER): Payer: Medicare Other

## 2018-12-26 DIAGNOSIS — J309 Allergic rhinitis, unspecified: Secondary | ICD-10-CM | POA: Diagnosis not present

## 2019-01-04 ENCOUNTER — Ambulatory Visit (INDEPENDENT_AMBULATORY_CARE_PROVIDER_SITE_OTHER): Payer: Medicare Other | Admitting: *Deleted

## 2019-01-04 DIAGNOSIS — M65332 Trigger finger, left middle finger: Secondary | ICD-10-CM | POA: Diagnosis not present

## 2019-01-04 DIAGNOSIS — M67432 Ganglion, left wrist: Secondary | ICD-10-CM | POA: Diagnosis not present

## 2019-01-04 DIAGNOSIS — M25532 Pain in left wrist: Secondary | ICD-10-CM | POA: Diagnosis not present

## 2019-01-04 DIAGNOSIS — J309 Allergic rhinitis, unspecified: Secondary | ICD-10-CM | POA: Diagnosis not present

## 2019-01-09 ENCOUNTER — Ambulatory Visit (INDEPENDENT_AMBULATORY_CARE_PROVIDER_SITE_OTHER): Payer: Medicare Other | Admitting: *Deleted

## 2019-01-09 DIAGNOSIS — J309 Allergic rhinitis, unspecified: Secondary | ICD-10-CM

## 2019-01-16 ENCOUNTER — Ambulatory Visit (INDEPENDENT_AMBULATORY_CARE_PROVIDER_SITE_OTHER): Payer: Medicare Other | Admitting: *Deleted

## 2019-01-16 DIAGNOSIS — J309 Allergic rhinitis, unspecified: Secondary | ICD-10-CM | POA: Diagnosis not present

## 2019-01-23 ENCOUNTER — Ambulatory Visit (INDEPENDENT_AMBULATORY_CARE_PROVIDER_SITE_OTHER): Payer: Medicare Other

## 2019-01-23 DIAGNOSIS — J309 Allergic rhinitis, unspecified: Secondary | ICD-10-CM

## 2019-02-01 DIAGNOSIS — E119 Type 2 diabetes mellitus without complications: Secondary | ICD-10-CM | POA: Diagnosis not present

## 2019-02-06 ENCOUNTER — Ambulatory Visit (INDEPENDENT_AMBULATORY_CARE_PROVIDER_SITE_OTHER): Payer: Medicare Other | Admitting: *Deleted

## 2019-02-06 DIAGNOSIS — J309 Allergic rhinitis, unspecified: Secondary | ICD-10-CM

## 2019-03-01 ENCOUNTER — Ambulatory Visit (INDEPENDENT_AMBULATORY_CARE_PROVIDER_SITE_OTHER): Payer: Medicare Other | Admitting: *Deleted

## 2019-03-01 DIAGNOSIS — J309 Allergic rhinitis, unspecified: Secondary | ICD-10-CM | POA: Diagnosis not present

## 2019-03-01 DIAGNOSIS — M65332 Trigger finger, left middle finger: Secondary | ICD-10-CM | POA: Diagnosis not present

## 2019-03-08 ENCOUNTER — Ambulatory Visit: Payer: Self-pay

## 2019-03-08 ENCOUNTER — Encounter: Payer: Self-pay | Admitting: Allergy

## 2019-03-08 ENCOUNTER — Other Ambulatory Visit: Payer: Self-pay

## 2019-03-08 ENCOUNTER — Ambulatory Visit (INDEPENDENT_AMBULATORY_CARE_PROVIDER_SITE_OTHER): Payer: Medicare Other | Admitting: Allergy

## 2019-03-08 VITALS — BP 118/88 | HR 84 | Temp 97.6°F | Resp 16 | Ht 63.5 in | Wt 183.0 lb

## 2019-03-08 DIAGNOSIS — J3089 Other allergic rhinitis: Secondary | ICD-10-CM

## 2019-03-08 DIAGNOSIS — J309 Allergic rhinitis, unspecified: Secondary | ICD-10-CM

## 2019-03-08 DIAGNOSIS — T781XXD Other adverse food reactions, not elsewhere classified, subsequent encounter: Secondary | ICD-10-CM | POA: Diagnosis not present

## 2019-03-08 MED ORDER — IPRATROPIUM BROMIDE 0.06 % NA SOLN: NASAL | 5 refills | Status: DC

## 2019-03-08 NOTE — Progress Notes (Signed)
Follow-up Note  RE: Miranda Stark MRN: [id number] DOB: [birth year] Date of Office Visit: 03/08/2019   History of present illness: Miranda Stark is a 68 y.o. female presenting today for follow-up of allergic rhinitis and pollen food allergy syndrome.  She was last seen as a televisit on 09/01/2018 by myself. She states this time of the year is her worst allergy season.  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 She does state that she has been having more burning eyes any postnasal drainage and mild stuffiness.  She states she does use her Claritin daily and uses the azelastine in the mornings but does not feel that it is helping with her nasal drainage control.  She states she does not feel she has needed to use her nasal steroid spray at this time. She is on allergen immunotherapy in the green vial and is building up well without any major large local or systemic reactions.  She does have access to an epinephrine device.  She states related to Covid she has been going on 2week isolation periods so that she can visit family members.  Because of that she may have several times where she has to repeat her last allergy dose.  She also has noted sometimes after she gets her shot within the hour that her mouth will feel different and states it may feel "warmer" but is not bothersome and she does not have any trouble breathing or swallowing.  This does not happen after every injection.  She has never needed to treat this symptom.  She will continue to watch her for this with her shots.  Review of systems: Review of Systems  Constitutional: Negative for chills, fever and malaise/fatigue.  HENT: Positive for congestion. Negative for ear discharge, nosebleeds and sinus pain.   Eyes: Negative for discharge and redness.  Respiratory: Negative.   Cardiovascular: Negative.   Gastrointestinal: Negative.    Musculoskeletal: Negative.   Skin: Negative for itching and rash.  Neurological: Negative.     All other systems negative unless noted above in HPI  Past medical/social/surgical/family history have been reviewed and are unchanged unless specifically indicated below.  No changes  Medication List: Current Outpatient Medications  Medication Sig Dispense Refill  . amphetamine-dextroamphetamine (ADDERALL) 5 MG tablet Take 5 mg by mouth as needed.     Marland Kitchen azelastine (ASTELIN) 0.1 % nasal spray Place 1 spray into both nostrils 2 (two) times daily as needed for rhinitis or allergies. 30 mL 5  . Calcium Carbonate Antacid (TUMS E-X 750 PO) Take 4 capsules by mouth daily.    . Cholecalciferol (VITAMIN D3 PO) Vitamin D3 50 mcg (2,000 unit) capsule   1 capsule every day by oral route.    . cromolyn (NASALCROM) 5.2 MG/ACT nasal spray Place 1 spray into both nostrils 4 (four) times daily.    . cyclobenzaprine (FLEXERIL) 10 MG tablet Take 10 mg by mouth at bedtime as needed (fibromyalgia).     . EPINEPHrine 0.3 mg/0.3 mL IJ SOAJ injection Use for life threatening allergic reactions 1 Device 1  . fluticasone (FLONASE) 50 MCG/ACT nasal spray Place 1 spray into both nostrils daily. 16 g 4  . gabapentin (NEURONTIN) 300 MG capsule Take 300 mg by mouth 2 (two) times daily.    Marland Kitchen HYDROcodone-acetaminophen (NORCO/VICODIN) 5-325 MG per tablet Take 1-2 tablets by mouth every 4 (  four) hours as needed (mild pain). 60 tablet 0  . loratadine (CLARITIN) 10 MG tablet Take 10 mg by mouth daily as needed for allergies.    Marland Kitchen LORazepam (ATIVAN) 1 MG tablet Take 1 mg by mouth at bedtime as needed for sleep.     Marland Kitchen losartan (COZAAR) 100 MG tablet Take 50 mg by mouth daily after supper.   6  . Magnesium 500 MG TABS Take 500 mg by mouth every other day.    . Melatonin 10 MG TABS Take 1 tablet by mouth at bedtime as needed.    . metFORMIN (GLUCOPHAGE) 500 MG tablet Take 500 mg by mouth daily.    . Multiple Vitamins-Minerals  (LUTEIN-ZEAXANTHIN PO) Take by mouth.    Marland Kitchen VITAMIN E PO Take by mouth.    Marland Kitchen ipratropium (ATROVENT) 0.06 % nasal spray 2 sprays 3-4 times as needed for nasal drainage. 15 mL 5   No current facility-administered medications for this visit.     Known medication allergies: Allergies  Allergen Reactions  . Actifed Cold-Allergy [Chlorpheniramine-Phenyleph Er] Other (See Comments)    tachycardia  . Ampicillin Other (See Comments)    Causes yeast  . Beef-Derived Products Other (See Comments)    Mood swings  . Benadryl [Diphenhydramine] Other (See Comments)    Restless legs  . Darvocet [Propoxyphene N-Acetaminophen] Other (See Comments)    Floating sensation  . Darvon Compound Other (See Comments)    Floating sensation  . Indomethacin Other (See Comments)    Diarrhea, burning sensation lower abd  . Tofranil-Pm Other (See Comments)    Drug educed hepatitis  . Ultram [Tramadol Hcl] Other (See Comments)    Severe syncope     Physical examination: Blood pressure 118/88, pulse 84, temperature 97.6 F (36.4 C), temperature source Temporal, resp. rate 16, height 5' 3.5" (1.613 m), weight 183 lb (83 kg), SpO2 96 %.  General: Alert, interactive, in no acute distress. HEENT: PERRLA, TMs pearly gray, turbinates non-edematous without discharge, post-pharynx non erythematous. Neck: Supple without lymphadenopathy. Lungs: Clear to auscultation without wheezing, rhonchi or rales. {no increased work of breathing. CV: Normal S1, S2 without murmurs. Abdomen: Nondistended, nontender. Skin: Warm and dry, without lesions or rashes. Extremities:  No clubbing, cyanosis or edema. Neuro:   Grossly intact.  Diagnositics/Labs: None today  Assessment and plan:   Allergic rhinitis  - continue avoidance measures for weed pollen, tree pollen, cat, dust mite and mold  - continue Claritin daily as needed  - try nasal Atrovent 2 sprays each nostril 3-4 times a day as needed for nasal drainage control.  This  is to replace Azelastine that has not been effective in controlling drainage.   - continue nasal steroid spray like Nasacort or Flonase 2 sprays daily for nasal congestion.  Use as needed for 1-2 weeks at time before stopping once symptoms improve.    - continue allergen immunotherapy (allergy shots) per protocol.  Carry your epinephrine device on days of your injection.  Monitor mouth symptoms after your shots.   Pollen food allergy syndrome  - The oral allergy syndrome (OAS) or pollen-food allergy syndrome (PFAS) is a relatively common form of food allergy, particularly in adults. It typically occurs in people who have pollen allergies when the immune system "sees" proteins on the food that look like proteins on the pollen. This results in the allergy antibody (IgE) binding to the food instead of the pollen. Patients typically report itching and/or mild swelling of the mouth and throat immediately  following ingestion of certain uncooked fruits (including nuts) or raw vegetables. Only a very small number of affected individuals experience systemic allergic reactions, such as anaphylaxis which occurs with true food allergies.    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  Would continue avoidance of fresh pineapple to avoid oral symptoms  Follow-up 6 months or sooner if needed  I appreciate the opportunity to take part in Miranda Stark's care. Please do not hesitate to contact me with questions.  Sincerely,   Prudy Feeler, MD Allergy/Immunology [facility]

## 2019-03-08 NOTE — Patient Instructions (Signed)
Allergic rhinitis  - continue avoidance measures for weed pollen, tree pollen, cat, dust mite and mold  - continue Claritin daily as needed  - try nasal Atrovent 2 sprays each nostril 3-4 times a day as needed for nasal drainage control.  This is to replace Azelastine that has not been effective in controlling drainage.   - continue nasal steroid spray like Nasacort or Flonase 2 sprays daily for nasal congestion.  Use as needed for 1-2 weeks at time before stopping once symptoms improve.    - continue allergen immunotherapy (allergy shots) per protocol.  Carry your epinephrine device on days of your injection.  Monitor mouth symptoms after your shots.   Pollen food allergy syndrome  - The oral allergy syndrome (OAS) or pollen-food allergy syndrome (PFAS) is a relatively common form of food allergy, particularly in adults. It typically occurs in people who have pollen allergies when the immune system "sees" proteins on the food that look like proteins on the pollen. This results in the allergy antibody (IgE) binding to the food instead of the pollen. Patients typically report itching and/or mild swelling of the mouth and throat immediately following ingestion of certain uncooked fruits (including nuts) or raw vegetables. Only a very small number of affected individuals experience systemic allergic reactions, such as anaphylaxis which occurs with true food allergies.    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  Would continue avoidance of fresh pineapple to avoid oral symptoms   Follow-up 6 months or sooner if needed

## 2019-03-12 ENCOUNTER — Encounter (HOSPITAL_COMMUNITY): Payer: Medicare Other

## 2019-03-19 ENCOUNTER — Encounter (HOSPITAL_COMMUNITY): Payer: Medicare Other

## 2019-03-27 ENCOUNTER — Ambulatory Visit (INDEPENDENT_AMBULATORY_CARE_PROVIDER_SITE_OTHER): Payer: Medicare Other

## 2019-03-27 DIAGNOSIS — J309 Allergic rhinitis, unspecified: Secondary | ICD-10-CM | POA: Diagnosis not present

## 2019-03-28 ENCOUNTER — Ambulatory Visit (HOSPITAL_COMMUNITY)
Admission: RE | Admit: 2019-03-28 | Discharge: 2019-03-28 | Disposition: A | Discharge status: Home / Self Care | Payer: Medicare Other | Source: Ambulatory Visit | Attending: Orthopaedic Surgery | Admitting: Orthopaedic Surgery

## 2019-03-28 ENCOUNTER — Other Ambulatory Visit: Payer: Self-pay

## 2019-03-28 LAB — POCT HEMOGLOBIN-HEMACUE: Hemoglobin: 13.9 g/dL (ref 12.0–15.0)

## 2019-04-02 DIAGNOSIS — E119 Type 2 diabetes mellitus without complications: Secondary | ICD-10-CM | POA: Diagnosis not present

## 2019-04-02 DIAGNOSIS — E7800 Pure hypercholesterolemia, unspecified: Secondary | ICD-10-CM | POA: Diagnosis not present

## 2019-04-05 ENCOUNTER — Ambulatory Visit (INDEPENDENT_AMBULATORY_CARE_PROVIDER_SITE_OTHER): Payer: Medicare Other

## 2019-04-05 DIAGNOSIS — E7800 Pure hypercholesterolemia, unspecified: Secondary | ICD-10-CM | POA: Diagnosis not present

## 2019-04-05 DIAGNOSIS — J309 Allergic rhinitis, unspecified: Secondary | ICD-10-CM | POA: Diagnosis not present

## 2019-04-05 DIAGNOSIS — E119 Type 2 diabetes mellitus without complications: Secondary | ICD-10-CM | POA: Diagnosis not present

## 2019-04-09 ENCOUNTER — Ambulatory Visit (INDEPENDENT_AMBULATORY_CARE_PROVIDER_SITE_OTHER): Payer: Medicare Other

## 2019-04-09 DIAGNOSIS — J309 Allergic rhinitis, unspecified: Secondary | ICD-10-CM | POA: Diagnosis not present

## 2019-04-19 ENCOUNTER — Ambulatory Visit (INDEPENDENT_AMBULATORY_CARE_PROVIDER_SITE_OTHER): Payer: Medicare Other

## 2019-04-19 DIAGNOSIS — J309 Allergic rhinitis, unspecified: Secondary | ICD-10-CM | POA: Diagnosis not present

## 2019-04-24 ENCOUNTER — Ambulatory Visit (INDEPENDENT_AMBULATORY_CARE_PROVIDER_SITE_OTHER): Payer: Medicare Other

## 2019-04-24 DIAGNOSIS — J309 Allergic rhinitis, unspecified: Secondary | ICD-10-CM | POA: Diagnosis not present

## 2019-05-03 ENCOUNTER — Ambulatory Visit (INDEPENDENT_AMBULATORY_CARE_PROVIDER_SITE_OTHER): Payer: Medicare Other

## 2019-05-03 DIAGNOSIS — J309 Allergic rhinitis, unspecified: Secondary | ICD-10-CM

## 2019-05-04 ENCOUNTER — Ambulatory Visit: Payer: Medicare Other | Attending: Internal Medicine

## 2019-05-04 DIAGNOSIS — Z23 Encounter for immunization: Secondary | ICD-10-CM | POA: Insufficient documentation

## 2019-05-04 NOTE — Progress Notes (Signed)
   Covid-19 Vaccination Clinic  Name:  Miranda Stark    MRN: [id number] DOB: April 21, 1950  05/04/2019  [name] was observed post Covid-19 immunization for 15 minutes without incidence. She was provided with Vaccine Information Sheet and instruction to access the V-Safe system.   [name] was instructed to call 911 with any severe reactions post vaccine: Marland Kitchen Difficulty breathing  . Swelling of your face and throat  . A fast heartbeat  . A bad rash all over your body  . Dizziness and weakness    Immunizations Administered    Name Date Dose VIS Date Route   Pfizer COVID-19 Vaccine 05/04/2019 11:47 AM 0.3 mL 03/09/2019 Intramuscular   Manufacturer: [place]   Lot: [id number]   [place]: [id number]

## 2019-05-10 ENCOUNTER — Ambulatory Visit (INDEPENDENT_AMBULATORY_CARE_PROVIDER_SITE_OTHER): Payer: Medicare Other

## 2019-05-10 DIAGNOSIS — J309 Allergic rhinitis, unspecified: Secondary | ICD-10-CM | POA: Diagnosis not present

## 2019-05-15 ENCOUNTER — Ambulatory Visit (INDEPENDENT_AMBULATORY_CARE_PROVIDER_SITE_OTHER): Payer: Medicare Other

## 2019-05-15 DIAGNOSIS — J309 Allergic rhinitis, unspecified: Secondary | ICD-10-CM | POA: Diagnosis not present

## 2019-05-21 ENCOUNTER — Ambulatory Visit: Payer: Medicare Other

## 2019-05-24 ENCOUNTER — Ambulatory Visit (INDEPENDENT_AMBULATORY_CARE_PROVIDER_SITE_OTHER): Payer: Medicare Other

## 2019-05-24 DIAGNOSIS — J309 Allergic rhinitis, unspecified: Secondary | ICD-10-CM | POA: Diagnosis not present

## 2019-05-29 ENCOUNTER — Ambulatory Visit: Payer: Medicare Other | Attending: Internal Medicine

## 2019-05-29 DIAGNOSIS — Z23 Encounter for immunization: Secondary | ICD-10-CM

## 2019-05-29 NOTE — Progress Notes (Signed)
   Covid-19 Vaccination Clinic  Name:  Miranda Stark    MRN: [id number] DOB: 09-27-1950  05/29/2019  Miranda Stark was observed post Covid-19 immunization for 15 minutes without incident. She was provided with Vaccine Information Sheet and instruction to access the V-Safe system.   Miranda Stark was instructed to call 911 with any severe reactions post vaccine: Marland Kitchen Difficulty breathing  . Swelling of face and throat  . A fast heartbeat  . A bad rash all over body  . Dizziness and weakness   Immunizations Administered    Name Date Dose VIS Date Route   Pfizer COVID-19 Vaccine 05/29/2019 11:25 AM 0.3 mL 03/09/2019 Intramuscular   Manufacturer: [place]   Lot: [id number]   [place]: [id number]

## 2019-06-04 ENCOUNTER — Ambulatory Visit (INDEPENDENT_AMBULATORY_CARE_PROVIDER_SITE_OTHER): Payer: Medicare Other

## 2019-06-04 DIAGNOSIS — J309 Allergic rhinitis, unspecified: Secondary | ICD-10-CM | POA: Diagnosis not present

## 2019-06-04 DIAGNOSIS — M65332 Trigger finger, left middle finger: Secondary | ICD-10-CM | POA: Diagnosis not present

## 2019-06-12 ENCOUNTER — Ambulatory Visit (INDEPENDENT_AMBULATORY_CARE_PROVIDER_SITE_OTHER): Payer: Medicare Other | Admitting: *Deleted

## 2019-06-12 DIAGNOSIS — J309 Allergic rhinitis, unspecified: Secondary | ICD-10-CM | POA: Diagnosis not present

## 2019-06-19 ENCOUNTER — Ambulatory Visit (INDEPENDENT_AMBULATORY_CARE_PROVIDER_SITE_OTHER): Payer: Medicare Other | Admitting: *Deleted

## 2019-06-19 DIAGNOSIS — J309 Allergic rhinitis, unspecified: Secondary | ICD-10-CM

## 2019-06-19 NOTE — Progress Notes (Signed)
VIALS EXP 06-18-20 

## 2019-06-20 DIAGNOSIS — J301 Allergic rhinitis due to pollen: Secondary | ICD-10-CM | POA: Diagnosis not present

## 2019-06-21 DIAGNOSIS — J3089 Other allergic rhinitis: Secondary | ICD-10-CM | POA: Diagnosis not present

## 2019-06-26 ENCOUNTER — Ambulatory Visit (INDEPENDENT_AMBULATORY_CARE_PROVIDER_SITE_OTHER): Payer: Medicare Other

## 2019-06-26 DIAGNOSIS — J309 Allergic rhinitis, unspecified: Secondary | ICD-10-CM

## 2019-06-27 ENCOUNTER — Other Ambulatory Visit: Payer: Self-pay

## 2019-06-27 ENCOUNTER — Ambulatory Visit (HOSPITAL_COMMUNITY)
Admission: RE | Admit: 2019-06-27 | Discharge: 2019-06-27 | Disposition: A | Discharge status: Home / Self Care | Payer: Medicare Other | Source: Ambulatory Visit | Attending: Internal Medicine | Admitting: Internal Medicine

## 2019-06-27 LAB — POCT HEMOGLOBIN-HEMACUE: Hemoglobin: 14.2 g/dL (ref 12.0–15.0)

## 2019-07-04 ENCOUNTER — Ambulatory Visit (INDEPENDENT_AMBULATORY_CARE_PROVIDER_SITE_OTHER): Payer: Medicare Other | Admitting: *Deleted

## 2019-07-04 DIAGNOSIS — J309 Allergic rhinitis, unspecified: Secondary | ICD-10-CM

## 2019-07-10 ENCOUNTER — Ambulatory Visit (INDEPENDENT_AMBULATORY_CARE_PROVIDER_SITE_OTHER): Payer: Medicare Other

## 2019-07-10 DIAGNOSIS — J309 Allergic rhinitis, unspecified: Secondary | ICD-10-CM

## 2019-07-17 ENCOUNTER — Ambulatory Visit (INDEPENDENT_AMBULATORY_CARE_PROVIDER_SITE_OTHER): Payer: Medicare Other

## 2019-07-17 DIAGNOSIS — J309 Allergic rhinitis, unspecified: Secondary | ICD-10-CM

## 2019-07-24 ENCOUNTER — Ambulatory Visit (INDEPENDENT_AMBULATORY_CARE_PROVIDER_SITE_OTHER): Payer: Medicare Other

## 2019-07-24 DIAGNOSIS — J309 Allergic rhinitis, unspecified: Secondary | ICD-10-CM | POA: Diagnosis not present

## 2019-07-31 ENCOUNTER — Ambulatory Visit (INDEPENDENT_AMBULATORY_CARE_PROVIDER_SITE_OTHER): Payer: Medicare Other

## 2019-07-31 DIAGNOSIS — E119 Type 2 diabetes mellitus without complications: Secondary | ICD-10-CM | POA: Diagnosis not present

## 2019-07-31 DIAGNOSIS — J309 Allergic rhinitis, unspecified: Secondary | ICD-10-CM | POA: Diagnosis not present

## 2019-07-31 DIAGNOSIS — E7800 Pure hypercholesterolemia, unspecified: Secondary | ICD-10-CM | POA: Diagnosis not present

## 2019-08-07 ENCOUNTER — Ambulatory Visit (INDEPENDENT_AMBULATORY_CARE_PROVIDER_SITE_OTHER): Payer: Medicare Other

## 2019-08-07 DIAGNOSIS — J309 Allergic rhinitis, unspecified: Secondary | ICD-10-CM | POA: Diagnosis not present

## 2019-08-14 ENCOUNTER — Ambulatory Visit (INDEPENDENT_AMBULATORY_CARE_PROVIDER_SITE_OTHER): Payer: Medicare Other

## 2019-08-14 DIAGNOSIS — J309 Allergic rhinitis, unspecified: Secondary | ICD-10-CM

## 2019-08-16 DIAGNOSIS — E119 Type 2 diabetes mellitus without complications: Secondary | ICD-10-CM | POA: Diagnosis not present

## 2019-08-16 DIAGNOSIS — Z0000 Encounter for general adult medical examination without abnormal findings: Secondary | ICD-10-CM | POA: Diagnosis not present

## 2019-08-16 DIAGNOSIS — Z23 Encounter for immunization: Secondary | ICD-10-CM | POA: Diagnosis not present

## 2019-08-16 DIAGNOSIS — I10 Essential (primary) hypertension: Secondary | ICD-10-CM | POA: Diagnosis not present

## 2019-08-21 ENCOUNTER — Ambulatory Visit (INDEPENDENT_AMBULATORY_CARE_PROVIDER_SITE_OTHER): Payer: Medicare Other

## 2019-08-21 DIAGNOSIS — J309 Allergic rhinitis, unspecified: Secondary | ICD-10-CM | POA: Diagnosis not present

## 2019-08-28 ENCOUNTER — Ambulatory Visit (INDEPENDENT_AMBULATORY_CARE_PROVIDER_SITE_OTHER): Payer: Medicare Other

## 2019-08-28 DIAGNOSIS — J309 Allergic rhinitis, unspecified: Secondary | ICD-10-CM | POA: Diagnosis not present

## 2019-08-31 DIAGNOSIS — Z1231 Encounter for screening mammogram for malignant neoplasm of breast: Secondary | ICD-10-CM | POA: Diagnosis not present

## 2019-08-31 DIAGNOSIS — Z01419 Encounter for gynecological examination (general) (routine) without abnormal findings: Secondary | ICD-10-CM | POA: Diagnosis not present

## 2019-08-31 DIAGNOSIS — Z6832 Body mass index (BMI) 32.0-32.9, adult: Secondary | ICD-10-CM | POA: Diagnosis not present

## 2019-09-06 ENCOUNTER — Ambulatory Visit (INDEPENDENT_AMBULATORY_CARE_PROVIDER_SITE_OTHER): Payer: Medicare Other | Admitting: Allergy

## 2019-09-06 ENCOUNTER — Other Ambulatory Visit: Payer: Self-pay

## 2019-09-06 ENCOUNTER — Encounter: Payer: Self-pay | Admitting: Allergy

## 2019-09-06 ENCOUNTER — Ambulatory Visit: Payer: Self-pay

## 2019-09-06 VITALS — BP 118/78 | HR 85 | Resp 18

## 2019-09-06 DIAGNOSIS — J3089 Other allergic rhinitis: Secondary | ICD-10-CM

## 2019-09-06 DIAGNOSIS — T781XXD Other adverse food reactions, not elsewhere classified, subsequent encounter: Secondary | ICD-10-CM

## 2019-09-06 DIAGNOSIS — J309 Allergic rhinitis, unspecified: Secondary | ICD-10-CM

## 2019-09-06 NOTE — Patient Instructions (Signed)
Allergic rhinitis  - continue avoidance measures for weed pollen, tree pollen, cat, dust mite and mold  - continue Claritin daily in AM  - continue nasal Atrovent 2 sprays each nostril twice a day.  May use as much as 3-4 times a day as needed for nasal drainage control.    - continue nasal steroid spray like Nasacort or Flonase 2 sprays daily for nasal congestion if needed.  Use as needed for 1-2 weeks at time before stopping once symptoms improve.    - continue allergen immunotherapy (allergy shots) per protocol.  Carry your epinephrine device on days of your injection.    Pollen food allergy syndrome  - The oral allergy syndrome (OAS) or pollen-food allergy syndrome (PFAS) is a relatively common form of food allergy, particularly in adults. It typically occurs in people who have pollen allergies when the immune system "sees" proteins on the food that look like proteins on the pollen. This results in the allergy antibody (IgE) binding to the food instead of the pollen. Patients typically report itching and/or mild swelling of the mouth and throat immediately following ingestion of certain uncooked fruits (including nuts) or raw vegetables. Only a very small number of affected individuals experience systemic allergic reactions, such as anaphylaxis which occurs with true food allergies.    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  Would continue avoidance of fresh pineapple to avoid oral symptoms   Follow-up 12 months or sooner if needed

## 2019-09-06 NOTE — Progress Notes (Signed)
Follow-up Note  RE: Miranda Stark MRN: [id number] DOB: 1951/01/22 Date of Office Visit: 09/06/2019   History of present illness: Miranda Stark is a 69 y.o. female presenting today for follow-up of allergic rhinitis with pollen food allergy syndrome.  She was last seen in the office on 03/08/2019 by myself.  She states this morning she had brunch out this morning and did not take her antihistamine.  She received her allergen injection today and did have some itching at the site of her left arm (mite/mold).   She is taking claritin in the mornings.  She does take additional antihistamine on days of her injection typically.  She still feels like the claritin is effective for her.  She states the nasal Atrovent has been the best nasal spray for her and wishes she would have had it years ago.  She uses it in morning and night.    Review of systems: Review of Systems  Constitutional: Negative.   HENT: Negative.   Eyes: Negative.   Respiratory: Negative.   Cardiovascular: Negative.   Gastrointestinal: Negative.   Musculoskeletal: Negative.   Skin: Negative.   Neurological: Negative.     All other systems negative unless noted above in HPI  Past medical/social/surgical/family history have been reviewed and are unchanged unless specifically indicated below.  No changes  Medication List: Current Outpatient Medications  Medication Sig Dispense Refill  . amphetamine-dextroamphetamine (ADDERALL) 5 MG tablet Take 5 mg by mouth as needed.     Marland Kitchen azelastine (ASTELIN) 0.1 % nasal spray Place 1 spray into both nostrils 2 (two) times daily as needed for rhinitis or allergies. 30 mL 5  . Calcium Carbonate Antacid (TUMS E-X 750 PO) Take 4 capsules by mouth daily.    . Cholecalciferol (VITAMIN D3 PO) Vitamin D3 50 mcg (2,000 unit) capsule   1 capsule every day by oral route.    . cromolyn (NASALCROM) 5.2 MG/ACT nasal spray Place 1 spray into both nostrils 4 (four) times daily.    . cyclobenzaprine  (FLEXERIL) 10 MG tablet Take 10 mg by mouth at bedtime as needed (fibromyalgia).     . EPINEPHrine 0.3 mg/0.3 mL IJ SOAJ injection Use for life threatening allergic reactions 1 Device 1  . fluticasone (FLONASE) 50 MCG/ACT nasal spray Place 1 spray into both nostrils daily. 16 g 4  . gabapentin (NEURONTIN) 300 MG capsule Take 300 mg by mouth 2 (two) times daily.    Marland Kitchen HYDROcodone-acetaminophen (NORCO/VICODIN) 5-325 MG per tablet Take 1-2 tablets by mouth every 4 (four) hours as needed (mild pain). 60 tablet 0  . ipratropium (ATROVENT) 0.06 % nasal spray 2 sprays 3-4 times as needed for nasal drainage. 15 mL 5  . loratadine (CLARITIN) 10 MG tablet Take 10 mg by mouth daily as needed for allergies.    Marland Kitchen LORazepam (ATIVAN) 1 MG tablet Take 1 mg by mouth at bedtime as needed for sleep.     Marland Kitchen losartan (COZAAR) 100 MG tablet Take 50 mg by mouth daily after supper.   6  . Magnesium 500 MG TABS Take 500 mg by mouth every other day.    . Melatonin 10 MG TABS Take 1 tablet by mouth at bedtime as needed.    . metFORMIN (GLUCOPHAGE) 500 MG tablet Take 500 mg by mouth daily.    . Multiple Vitamins-Minerals (LUTEIN-ZEAXANTHIN PO) Take by mouth.    Marland Kitchen VITAMIN E PO Take by mouth.     No current facility-administered medications for this  visit.     Known medication allergies: Allergies  Allergen Reactions  . Actifed Cold-Allergy [Chlorpheniramine-Phenyleph Er] Other (See Comments)    tachycardia  . Ampicillin Other (See Comments)    Causes yeast  . Beef-Derived Products Other (See Comments)    Mood swings  . Benadryl [Diphenhydramine] Other (See Comments)    Restless legs  . Darvocet [Propoxyphene N-Acetaminophen] Other (See Comments)    Floating sensation  . Darvon Compound Other (See Comments)    Floating sensation  . Indomethacin Other (See Comments)    Diarrhea, burning sensation lower abd  . Tofranil-Pm Other (See Comments)    Drug educed hepatitis  . Ultram [Tramadol Hcl] Other (See Comments)      Severe syncope     Physical examination: Blood pressure 118/78, pulse 85, resp. rate 18, SpO2 98 %.  General: Alert, interactive, in no acute distress. HEENT: TMs pearly gray, turbinates non-edematous without discharge, post-pharynx non erythematous. Neck: Supple without lymphadenopathy. Lungs: Clear to auscultation without wheezing, rhonchi or rales. {no increased work of breathing. CV: Normal S1, S2 without murmurs. Abdomen: Nondistended, nontender. Skin: Warm and dry, without lesions or rashes. Extremities:  No clubbing, cyanosis or edema. Neuro:   Grossly intact.  Diagnositics/Labs: None today  Assessment and plan:   Allergic rhinitis  - continue avoidance measures for weed pollen, tree pollen, cat, dust mite and mold  - continue Claritin daily in AM  - continue nasal Atrovent 2 sprays each nostril twice a day.  May use as much as 3-4 times a day as needed for nasal drainage control.    - continue nasal steroid spray like Nasacort or Flonase 2 sprays daily for nasal congestion if needed.  Use as needed for 1-2 weeks at time before stopping once symptoms improve.    - continue allergen immunotherapy (allergy shots) per protocol.  Carry your epinephrine device on days of your injection.    Pollen food allergy syndrome  - The oral allergy syndrome (OAS) or pollen-food allergy syndrome (PFAS) is a relatively common form of food allergy, particularly in adults. It typically occurs in people who have pollen allergies when the immune system "sees" proteins on the food that look like proteins on the pollen. This results in the allergy antibody (IgE) binding to the food instead of the pollen. Patients typically report itching and/or mild swelling of the mouth and throat immediately following ingestion of certain uncooked fruits (including nuts) or raw vegetables. Only a very small number of affected individuals experience systemic allergic reactions, such as anaphylaxis which occurs with  true food allergies.    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  Would continue avoidance of fresh pineapple to avoid oral symptoms  Follow-up 12 months or sooner if needed  I appreciate the opportunity to take part in Miranda Stark's care. Please do not hesitate to contact me with questions.  Sincerely,   Prudy Feeler, MD Allergy/Immunology [facility]

## 2019-09-13 ENCOUNTER — Ambulatory Visit (INDEPENDENT_AMBULATORY_CARE_PROVIDER_SITE_OTHER): Payer: Medicare Other

## 2019-09-13 DIAGNOSIS — J309 Allergic rhinitis, unspecified: Secondary | ICD-10-CM

## 2019-09-21 ENCOUNTER — Ambulatory Visit (INDEPENDENT_AMBULATORY_CARE_PROVIDER_SITE_OTHER): Payer: Medicare Other

## 2019-09-21 DIAGNOSIS — J309 Allergic rhinitis, unspecified: Secondary | ICD-10-CM

## 2019-09-26 ENCOUNTER — Ambulatory Visit (HOSPITAL_COMMUNITY)
Admission: RE | Admit: 2019-09-26 | Discharge: 2019-09-26 | Disposition: A | Discharge status: Home / Self Care | Payer: Medicare Other | Source: Ambulatory Visit | Attending: Internal Medicine | Admitting: Internal Medicine

## 2019-09-26 ENCOUNTER — Other Ambulatory Visit: Payer: Self-pay

## 2019-09-26 LAB — POCT HEMOGLOBIN-HEMACUE: Hemoglobin: 14.2 g/dL (ref 12.0–15.0)

## 2019-09-27 ENCOUNTER — Ambulatory Visit (INDEPENDENT_AMBULATORY_CARE_PROVIDER_SITE_OTHER): Payer: Medicare Other

## 2019-09-27 DIAGNOSIS — J309 Allergic rhinitis, unspecified: Secondary | ICD-10-CM | POA: Diagnosis not present

## 2019-10-03 ENCOUNTER — Ambulatory Visit (INDEPENDENT_AMBULATORY_CARE_PROVIDER_SITE_OTHER): Payer: Medicare Other

## 2019-10-03 DIAGNOSIS — J309 Allergic rhinitis, unspecified: Secondary | ICD-10-CM | POA: Diagnosis not present

## 2019-10-10 DIAGNOSIS — J301 Allergic rhinitis due to pollen: Secondary | ICD-10-CM

## 2019-10-10 NOTE — Progress Notes (Signed)
Vials exp 10-09-20

## 2019-10-11 DIAGNOSIS — J3089 Other allergic rhinitis: Secondary | ICD-10-CM

## 2019-10-12 ENCOUNTER — Ambulatory Visit (INDEPENDENT_AMBULATORY_CARE_PROVIDER_SITE_OTHER): Payer: Medicare Other

## 2019-10-12 DIAGNOSIS — J309 Allergic rhinitis, unspecified: Secondary | ICD-10-CM

## 2019-10-18 ENCOUNTER — Ambulatory Visit (INDEPENDENT_AMBULATORY_CARE_PROVIDER_SITE_OTHER): Payer: Medicare Other

## 2019-10-18 DIAGNOSIS — J309 Allergic rhinitis, unspecified: Secondary | ICD-10-CM

## 2019-10-24 ENCOUNTER — Ambulatory Visit (INDEPENDENT_AMBULATORY_CARE_PROVIDER_SITE_OTHER): Payer: Medicare Other

## 2019-10-24 DIAGNOSIS — J309 Allergic rhinitis, unspecified: Secondary | ICD-10-CM | POA: Diagnosis not present

## 2019-11-02 ENCOUNTER — Ambulatory Visit (INDEPENDENT_AMBULATORY_CARE_PROVIDER_SITE_OTHER): Payer: Medicare Other

## 2019-11-02 DIAGNOSIS — J309 Allergic rhinitis, unspecified: Secondary | ICD-10-CM | POA: Diagnosis not present

## 2019-11-08 ENCOUNTER — Ambulatory Visit (INDEPENDENT_AMBULATORY_CARE_PROVIDER_SITE_OTHER): Payer: Medicare Other

## 2019-11-08 DIAGNOSIS — J309 Allergic rhinitis, unspecified: Secondary | ICD-10-CM

## 2019-11-15 ENCOUNTER — Ambulatory Visit (INDEPENDENT_AMBULATORY_CARE_PROVIDER_SITE_OTHER): Payer: Medicare Other

## 2019-11-15 DIAGNOSIS — J309 Allergic rhinitis, unspecified: Secondary | ICD-10-CM | POA: Diagnosis not present

## 2019-11-21 DIAGNOSIS — M65332 Trigger finger, left middle finger: Secondary | ICD-10-CM | POA: Diagnosis not present

## 2019-11-21 DIAGNOSIS — Z4789 Encounter for other orthopedic aftercare: Secondary | ICD-10-CM | POA: Diagnosis not present

## 2019-11-29 ENCOUNTER — Ambulatory Visit (INDEPENDENT_AMBULATORY_CARE_PROVIDER_SITE_OTHER): Payer: Medicare Other

## 2019-11-29 DIAGNOSIS — I10 Essential (primary) hypertension: Secondary | ICD-10-CM | POA: Diagnosis not present

## 2019-11-29 DIAGNOSIS — J309 Allergic rhinitis, unspecified: Secondary | ICD-10-CM

## 2019-11-29 DIAGNOSIS — E119 Type 2 diabetes mellitus without complications: Secondary | ICD-10-CM | POA: Diagnosis not present

## 2019-11-29 DIAGNOSIS — E7800 Pure hypercholesterolemia, unspecified: Secondary | ICD-10-CM | POA: Diagnosis not present

## 2019-12-04 ENCOUNTER — Ambulatory Visit (INDEPENDENT_AMBULATORY_CARE_PROVIDER_SITE_OTHER): Payer: Medicare Other

## 2019-12-04 DIAGNOSIS — J309 Allergic rhinitis, unspecified: Secondary | ICD-10-CM | POA: Diagnosis not present

## 2019-12-06 DIAGNOSIS — E119 Type 2 diabetes mellitus without complications: Secondary | ICD-10-CM | POA: Diagnosis not present

## 2019-12-06 DIAGNOSIS — Z23 Encounter for immunization: Secondary | ICD-10-CM | POA: Diagnosis not present

## 2019-12-06 DIAGNOSIS — F988 Other specified behavioral and emotional disorders with onset usually occurring in childhood and adolescence: Secondary | ICD-10-CM | POA: Diagnosis not present

## 2019-12-06 DIAGNOSIS — I10 Essential (primary) hypertension: Secondary | ICD-10-CM | POA: Diagnosis not present

## 2019-12-07 DIAGNOSIS — M79642 Pain in left hand: Secondary | ICD-10-CM | POA: Diagnosis not present

## 2019-12-13 ENCOUNTER — Other Ambulatory Visit (HOSPITAL_COMMUNITY): Payer: Self-pay | Admitting: *Deleted

## 2019-12-14 ENCOUNTER — Other Ambulatory Visit: Payer: Self-pay

## 2019-12-14 ENCOUNTER — Ambulatory Visit (HOSPITAL_COMMUNITY)
Admission: RE | Admit: 2019-12-14 | Discharge: 2019-12-14 | Disposition: A | Discharge status: Home / Self Care | Payer: Medicare Other | Source: Ambulatory Visit | Attending: Internal Medicine | Admitting: Internal Medicine

## 2019-12-14 ENCOUNTER — Inpatient Hospital Stay (HOSPITAL_COMMUNITY): Admission: RE | Admit: 2019-12-14 | Payer: Self-pay | Source: Ambulatory Visit

## 2019-12-18 ENCOUNTER — Ambulatory Visit (INDEPENDENT_AMBULATORY_CARE_PROVIDER_SITE_OTHER): Payer: Medicare Other | Admitting: *Deleted

## 2019-12-18 DIAGNOSIS — J309 Allergic rhinitis, unspecified: Secondary | ICD-10-CM

## 2020-01-03 ENCOUNTER — Ambulatory Visit (INDEPENDENT_AMBULATORY_CARE_PROVIDER_SITE_OTHER): Payer: Medicare Other

## 2020-01-03 DIAGNOSIS — J309 Allergic rhinitis, unspecified: Secondary | ICD-10-CM

## 2020-01-15 ENCOUNTER — Ambulatory Visit (INDEPENDENT_AMBULATORY_CARE_PROVIDER_SITE_OTHER): Payer: Medicare Other

## 2020-01-15 DIAGNOSIS — J309 Allergic rhinitis, unspecified: Secondary | ICD-10-CM

## 2020-01-17 NOTE — Progress Notes (Signed)
Vials exp 01-20-21

## 2020-01-21 DIAGNOSIS — J3081 Allergic rhinitis due to animal (cat) (dog) hair and dander: Secondary | ICD-10-CM | POA: Diagnosis not present

## 2020-01-21 NOTE — Progress Notes (Signed)
LABEL REVISED.

## 2020-01-22 DIAGNOSIS — J3089 Other allergic rhinitis: Secondary | ICD-10-CM | POA: Diagnosis not present

## 2020-01-29 ENCOUNTER — Ambulatory Visit (INDEPENDENT_AMBULATORY_CARE_PROVIDER_SITE_OTHER): Payer: Medicare Other | Admitting: *Deleted

## 2020-01-29 DIAGNOSIS — J309 Allergic rhinitis, unspecified: Secondary | ICD-10-CM | POA: Diagnosis not present

## 2020-02-08 DIAGNOSIS — E119 Type 2 diabetes mellitus without complications: Secondary | ICD-10-CM | POA: Diagnosis not present

## 2020-02-15 ENCOUNTER — Ambulatory Visit (INDEPENDENT_AMBULATORY_CARE_PROVIDER_SITE_OTHER): Payer: Medicare Other

## 2020-02-15 DIAGNOSIS — J309 Allergic rhinitis, unspecified: Secondary | ICD-10-CM

## 2020-02-29 ENCOUNTER — Ambulatory Visit (INDEPENDENT_AMBULATORY_CARE_PROVIDER_SITE_OTHER): Payer: Medicare Other | Admitting: *Deleted

## 2020-02-29 DIAGNOSIS — J309 Allergic rhinitis, unspecified: Secondary | ICD-10-CM

## 2020-03-11 ENCOUNTER — Ambulatory Visit (INDEPENDENT_AMBULATORY_CARE_PROVIDER_SITE_OTHER): Payer: Medicare Other | Admitting: *Deleted

## 2020-03-11 DIAGNOSIS — J309 Allergic rhinitis, unspecified: Secondary | ICD-10-CM

## 2020-03-14 ENCOUNTER — Other Ambulatory Visit: Payer: Self-pay

## 2020-03-14 ENCOUNTER — Ambulatory Visit (HOSPITAL_COMMUNITY)
Admission: RE | Admit: 2020-03-14 | Discharge: 2020-03-14 | Disposition: A | Discharge status: Home / Self Care | Payer: Medicare Other | Source: Ambulatory Visit | Attending: Family Medicine | Admitting: Family Medicine

## 2020-03-17 LAB — POCT HEMOGLOBIN-HEMACUE: Hemoglobin: 13 g/dL (ref 12.0–15.0)

## 2020-03-27 ENCOUNTER — Ambulatory Visit (INDEPENDENT_AMBULATORY_CARE_PROVIDER_SITE_OTHER): Payer: Medicare Other | Admitting: *Deleted

## 2020-03-27 DIAGNOSIS — J309 Allergic rhinitis, unspecified: Secondary | ICD-10-CM

## 2020-04-05 ENCOUNTER — Other Ambulatory Visit: Payer: Medicare Other

## 2020-04-05 DIAGNOSIS — Z20822 Contact with and (suspected) exposure to covid-19: Secondary | ICD-10-CM

## 2020-04-09 LAB — NOVEL CORONAVIRUS, NAA: SARS-CoV-2, NAA: DETECTED — AB

## 2020-04-24 ENCOUNTER — Ambulatory Visit (INDEPENDENT_AMBULATORY_CARE_PROVIDER_SITE_OTHER): Payer: Medicare Other | Admitting: *Deleted

## 2020-04-24 DIAGNOSIS — J309 Allergic rhinitis, unspecified: Secondary | ICD-10-CM

## 2020-05-07 ENCOUNTER — Ambulatory Visit (INDEPENDENT_AMBULATORY_CARE_PROVIDER_SITE_OTHER): Payer: Medicare Other | Admitting: *Deleted

## 2020-05-07 DIAGNOSIS — J309 Allergic rhinitis, unspecified: Secondary | ICD-10-CM

## 2020-05-13 ENCOUNTER — Ambulatory Visit (INDEPENDENT_AMBULATORY_CARE_PROVIDER_SITE_OTHER): Payer: Medicare Other | Admitting: *Deleted

## 2020-05-13 DIAGNOSIS — J309 Allergic rhinitis, unspecified: Secondary | ICD-10-CM | POA: Diagnosis not present

## 2020-05-22 ENCOUNTER — Ambulatory Visit (INDEPENDENT_AMBULATORY_CARE_PROVIDER_SITE_OTHER): Payer: Medicare Other

## 2020-05-22 DIAGNOSIS — J309 Allergic rhinitis, unspecified: Secondary | ICD-10-CM | POA: Diagnosis not present

## 2020-05-30 ENCOUNTER — Ambulatory Visit (INDEPENDENT_AMBULATORY_CARE_PROVIDER_SITE_OTHER): Payer: Medicare Other | Admitting: *Deleted

## 2020-05-30 DIAGNOSIS — J309 Allergic rhinitis, unspecified: Secondary | ICD-10-CM

## 2020-06-06 ENCOUNTER — Ambulatory Visit (INDEPENDENT_AMBULATORY_CARE_PROVIDER_SITE_OTHER): Payer: Medicare Other | Admitting: *Deleted

## 2020-06-06 DIAGNOSIS — J309 Allergic rhinitis, unspecified: Secondary | ICD-10-CM

## 2020-06-17 ENCOUNTER — Ambulatory Visit (INDEPENDENT_AMBULATORY_CARE_PROVIDER_SITE_OTHER): Payer: Medicare Other | Admitting: *Deleted

## 2020-06-17 DIAGNOSIS — J309 Allergic rhinitis, unspecified: Secondary | ICD-10-CM

## 2020-06-26 ENCOUNTER — Other Ambulatory Visit (HOSPITAL_COMMUNITY): Payer: Self-pay

## 2020-06-27 ENCOUNTER — Ambulatory Visit (HOSPITAL_COMMUNITY)
Admission: RE | Admit: 2020-06-27 | Discharge: 2020-06-27 | Disposition: A | Discharge status: Home / Self Care | Payer: Medicare Other | Source: Ambulatory Visit | Attending: Internal Medicine | Admitting: Internal Medicine

## 2020-06-27 ENCOUNTER — Other Ambulatory Visit: Payer: Self-pay

## 2020-06-27 LAB — POCT HEMOGLOBIN-HEMACUE: Hemoglobin: 15.5 g/dL — ABNORMAL HIGH (ref 12.0–15.0)

## 2020-06-27 NOTE — Progress Notes (Signed)
Hgb 15.5 via hemocue, right AC phlebotomized 200cc, left AC phlebotomized 300c.  Pt tolerated well.  Will continue to monitor

## 2020-07-01 ENCOUNTER — Ambulatory Visit (INDEPENDENT_AMBULATORY_CARE_PROVIDER_SITE_OTHER): Payer: Medicare Other | Admitting: *Deleted

## 2020-07-01 DIAGNOSIS — J309 Allergic rhinitis, unspecified: Secondary | ICD-10-CM | POA: Diagnosis not present

## 2020-07-15 ENCOUNTER — Ambulatory Visit (INDEPENDENT_AMBULATORY_CARE_PROVIDER_SITE_OTHER): Payer: Medicare Other | Admitting: *Deleted

## 2020-07-15 DIAGNOSIS — J309 Allergic rhinitis, unspecified: Secondary | ICD-10-CM

## 2020-07-22 DIAGNOSIS — J3081 Allergic rhinitis due to animal (cat) (dog) hair and dander: Secondary | ICD-10-CM | POA: Diagnosis not present

## 2020-07-22 NOTE — Progress Notes (Signed)
VIALS EXP 07-22-21 

## 2020-07-23 DIAGNOSIS — J3089 Other allergic rhinitis: Secondary | ICD-10-CM | POA: Diagnosis not present

## 2020-08-01 ENCOUNTER — Ambulatory Visit (INDEPENDENT_AMBULATORY_CARE_PROVIDER_SITE_OTHER): Payer: Medicare Other

## 2020-08-01 DIAGNOSIS — J309 Allergic rhinitis, unspecified: Secondary | ICD-10-CM | POA: Diagnosis not present

## 2020-08-11 ENCOUNTER — Other Ambulatory Visit: Payer: Self-pay

## 2020-08-11 ENCOUNTER — Ambulatory Visit (INDEPENDENT_AMBULATORY_CARE_PROVIDER_SITE_OTHER): Payer: Medicare Other | Admitting: *Deleted

## 2020-08-11 DIAGNOSIS — Z23 Encounter for immunization: Secondary | ICD-10-CM | POA: Diagnosis not present

## 2020-08-11 NOTE — Progress Notes (Signed)
   Covid-19 Vaccination Clinic  Name:  Miranda Stark    MRN: [id number] DOB: [birth year]  08/11/2020  [name] was observed post Covid-19 immunization for 15 minutes without incident. She was provided with Vaccine Information Sheet and instruction to access the V-Safe system.   [name] was instructed to call 911 with any severe reactions post vaccine: Marland Kitchen Difficulty breathing  . Swelling of face and throat  . A fast heartbeat  . A bad rash all over body  . Dizziness and weakness   Immunizations Administered    Name Date Dose VIS Date Route   PFIZER Comrnaty(Gray TOP) Covid-19 Vaccine 08/11/2020  3:25 PM 0.3 mL 03/06/2020 Intramuscular   Manufacturer: [place]   Lot: [id number]   [place]: [phone]

## 2020-08-15 ENCOUNTER — Ambulatory Visit (INDEPENDENT_AMBULATORY_CARE_PROVIDER_SITE_OTHER): Payer: Medicare Other

## 2020-08-15 DIAGNOSIS — J309 Allergic rhinitis, unspecified: Secondary | ICD-10-CM | POA: Diagnosis not present

## 2020-08-26 ENCOUNTER — Ambulatory Visit (INDEPENDENT_AMBULATORY_CARE_PROVIDER_SITE_OTHER): Payer: Medicare Other | Admitting: *Deleted

## 2020-08-26 DIAGNOSIS — J309 Allergic rhinitis, unspecified: Secondary | ICD-10-CM | POA: Diagnosis not present

## 2020-09-04 ENCOUNTER — Encounter: Payer: Self-pay | Admitting: Allergy

## 2020-09-04 ENCOUNTER — Ambulatory Visit: Payer: Self-pay | Admitting: *Deleted

## 2020-09-04 ENCOUNTER — Ambulatory Visit: Payer: Medicare Other | Admitting: Allergy

## 2020-09-04 ENCOUNTER — Other Ambulatory Visit: Payer: Self-pay

## 2020-09-04 VITALS — BP 122/80 | HR 98 | Temp 97.9°F | Resp 14 | Ht 64.0 in | Wt 189.9 lb

## 2020-09-04 DIAGNOSIS — J309 Allergic rhinitis, unspecified: Secondary | ICD-10-CM

## 2020-09-04 DIAGNOSIS — T781XXD Other adverse food reactions, not elsewhere classified, subsequent encounter: Secondary | ICD-10-CM

## 2020-09-04 DIAGNOSIS — J3089 Other allergic rhinitis: Secondary | ICD-10-CM

## 2020-09-04 NOTE — Patient Instructions (Addendum)
Allergic rhinitis  - continue avoidance measures for weed pollen, tree pollen, cat, dust mite and mold  - continue Claritin.  Recommended to see if she can reduce use to determine if she still needs Claritin daily.  Recommended to go to every other day dosing for a couple of weeks then to a couple times a week dosing and then as needed use.  - continue nasal Atrovent 2 sprays each nostril twice a day.  May use as much as 3-4 times a day as needed for nasal drainage control.    - continue nasal steroid spray like Nasacort or Flonase 2 sprays daily for nasal congestion if needed.  Use as needed for 1-2 weeks at time before stopping once symptoms improve.    - continue allergen immunotherapy (allergy shots) per protocol.  Carry your epinephrine device on days of your injection.  If she is able to wean down off Claritin as well as able to tolerate fresh pineapple, these would be good indications that her allergen immunotherapy has worked and that we can potentially stop around winter 2023  Pollen food allergy syndrome  - The oral allergy syndrome (OAS) or pollen-food allergy syndrome (PFAS) is a relatively common form of food allergy, particularly in adults. It typically occurs in people who have pollen allergies when the immune system "sees" proteins on the food that look like proteins on the pollen. This results in the allergy antibody (IgE) binding to the food instead of the pollen. Patients typically report itching and/or mild swelling of the mouth and throat immediately following ingestion of certain uncooked fruits (including nuts) or raw vegetables. Only a very small number of affected individuals experience systemic allergic reactions, such as anaphylaxis which occurs with true food allergies.    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  Since she has seen improvement in allergy symptom control on immunotherapy advised she can slowly reintroduce fresh pineapple into her  diet and see if she tolerates this.  If not then she will continue to avoid we will continue immunotherapy   Follow-up 12 months or sooner if needed

## 2020-09-04 NOTE — Progress Notes (Signed)
Follow-up Note  RE: Miranda Stark MRN: [id number] DOB: [birth year] Date of Office Visit: 09/04/2020   History of present illness: Miranda Stark is a 70 y.o. female presenting today for follow-up of allergic rhinitis and pollen food allergy syndrome.  She was last seen in the office on 09/06/2019 by myself.  She has not had any major health changes, surgeries or hospitalizations since her last visit.  She states she has had a good year.  Regards to her allergies the only thing she noticed on random occasions are as a bit of itchy nose.  She states she has not needed to use any of her nasal sprays which would include both Atrovent and nasal steroid spray.  She does continue to take Claritin daily.  She has not tried reduce this use.  She states prior to allergy shots her worst time of the year but have been from the fall into the spring.  She also continues to avoid fresh pineapple but states she can eat pineapple from the can.  Review of systems: Review of Systems  Constitutional: Negative.   HENT:         See HPI  Eyes: Negative.   Respiratory: Negative.    Cardiovascular: Negative.   Gastrointestinal: Negative.   Musculoskeletal: Negative.   Skin: Negative.   Neurological: Negative.    All other systems negative unless noted above in HPI  Past medical/social/surgical/family history have been reviewed and are unchanged unless specifically indicated below.  No changes  Medication List: Current Outpatient Medications  Medication Sig Dispense Refill   amphetamine-dextroamphetamine (ADDERALL) 5 MG tablet Take 5 mg by mouth as needed.      azelastine (ASTELIN) 0.1 % nasal spray Place 1 spray into both nostrils 2 (two) times daily as needed for rhinitis or allergies. 30 mL 5   Calcium Carbonate Antacid (TUMS E-X 750 PO) Take 4 capsules by mouth daily.     Cholecalciferol (VITAMIN D3 PO) Vitamin D3 50 mcg (2,000 unit) capsule   1 capsule every day by oral route.     cromolyn  (NASALCROM) 5.2 MG/ACT nasal spray Place 1 spray into both nostrils 4 (four) times daily.     cyclobenzaprine (FLEXERIL) 10 MG tablet Take 10 mg by mouth at bedtime as needed (fibromyalgia).      EPINEPHrine 0.3 mg/0.3 mL IJ SOAJ injection Use for life threatening allergic reactions 1 Device 1   fluticasone (FLONASE) 50 MCG/ACT nasal spray Place 1 spray into both nostrils daily. 16 g 4   gabapentin (NEURONTIN) 300 MG capsule Take 300 mg by mouth 2 (two) times daily.     HYDROcodone-acetaminophen (NORCO/VICODIN) 5-325 MG per tablet Take 1-2 tablets by mouth every 4 (four) hours as needed (mild pain). 60 tablet 0   ipratropium (ATROVENT) 0.06 % nasal spray 2 sprays 3-4 times as needed for nasal drainage. 15 mL 5   loratadine (CLARITIN) 10 MG tablet Take 10 mg by mouth daily as needed for allergies.     LORazepam (ATIVAN) 1 MG tablet Take 1 mg by mouth at bedtime as needed for sleep.      losartan (COZAAR) 100 MG tablet Take 50 mg by mouth daily after supper.   6   Magnesium 500 MG TABS Take 500 mg by mouth every other day.     Melatonin 10 MG TABS Take 1 tablet by mouth at bedtime as needed.     metFORMIN (GLUCOPHAGE) 500 MG tablet Take 500 mg by mouth daily.  Multiple Vitamins-Minerals (LUTEIN-ZEAXANTHIN PO) Take by mouth.     VITAMIN E PO Take by mouth.     No current facility-administered medications for this visit.     Known medication allergies: Allergies  Allergen Reactions   Actifed Cold-Allergy [Chlorpheniramine-Phenyleph Er] Other (See Comments)    tachycardia   Ampicillin Other (See Comments)    Causes yeast   Beef-Derived Products Other (See Comments)    Mood swings   Benadryl [Diphenhydramine] Other (See Comments)    Restless legs   Darvocet [Propoxyphene N-Acetaminophen] Other (See Comments)    Floating sensation   Darvon Compound Other (See Comments)    Floating sensation   Indomethacin Other (See Comments)    Diarrhea, burning sensation lower abd   Tofranil-Pm  Other (See Comments)    Drug educed hepatitis   Ultram [Tramadol Hcl] Other (See Comments)    Severe syncope     Physical examination: Blood pressure 122/80, pulse 98, temperature 97.9 F (36.6 C), temperature source Temporal, resp. rate 14, height 5\' 4"  (1.626 m), weight 189 lb 14.4 oz (86.1 kg), SpO2 98 %.  General: Alert, interactive, in no acute distress. HEENT: PERRLA, TMs pearly gray, turbinates non-edematous without discharge, post-pharynx non erythematous. Neck: Supple without lymphadenopathy. Lungs: Clear to auscultation without wheezing, rhonchi or rales. {no increased work of breathing. CV: Normal S1, S2 without murmurs. Abdomen: Nondistended, nontender. Skin: Warm and dry, without lesions or rashes. Extremities:  No clubbing, cyanosis or edema. Neuro:   Grossly intact.  Diagnositics/Labs: None today  Assessment and plan:   Allergic rhinitis  - continue avoidance measures for weed pollen, tree pollen, cat, dust mite and mold  - continue Claritin.  Recommended to see if she can reduce use to determine if she still needs Claritin daily.  Recommended to go to every other day dosing for a couple of weeks then to a couple times a week dosing and then as needed use.  - continue nasal Atrovent 2 sprays each nostril twice a day.  May use as much as 3-4 times a day as needed for nasal drainage control.    - continue nasal steroid spray like Nasacort or Flonase 2 sprays daily for nasal congestion if needed.  Use as needed for 1-2 weeks at time before stopping once symptoms improve.    - continue allergen immunotherapy (allergy shots) per protocol.  Carry your epinephrine device on days of your injection.  If she is able to wean down off Claritin as well as able to tolerate fresh pineapple, these would be good indications that her allergen immunotherapy has worked and that we can potentially stop around winter 2023  Pollen food allergy syndrome  - The oral allergy syndrome (OAS) or  pollen-food allergy syndrome (PFAS) is a relatively common form of food allergy, particularly in adults. It typically occurs in people who have pollen allergies when the immune system "sees" proteins on the food that look like proteins on the pollen. This results in the allergy antibody (IgE) binding to the food instead of the pollen. Patients typically report itching and/or mild swelling of the mouth and throat immediately following ingestion of certain uncooked fruits (including nuts) or raw vegetables. Only a very small number of affected individuals experience systemic allergic reactions, such as anaphylaxis which occurs with true food allergies.    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  Since she has seen improvement in allergy symptom control on immunotherapy advised she can slowly reintroduce fresh pineapple  into her diet and see if she tolerates this.  If not then she will continue to avoid we will continue immunotherapy   Follow-up 12 months or sooner if needed  I appreciate the opportunity to take part in Miranda Stark's care. Please do not hesitate to contact me with questions.  Sincerely,   Prudy Feeler, MD Allergy/Immunology [facility]

## 2020-09-08 ENCOUNTER — Ambulatory Visit (INDEPENDENT_AMBULATORY_CARE_PROVIDER_SITE_OTHER): Payer: Medicare Other

## 2020-09-08 DIAGNOSIS — J309 Allergic rhinitis, unspecified: Secondary | ICD-10-CM

## 2020-09-18 ENCOUNTER — Ambulatory Visit (INDEPENDENT_AMBULATORY_CARE_PROVIDER_SITE_OTHER): Payer: Medicare Other | Admitting: *Deleted

## 2020-09-18 DIAGNOSIS — J309 Allergic rhinitis, unspecified: Secondary | ICD-10-CM | POA: Diagnosis not present

## 2020-09-19 ENCOUNTER — Other Ambulatory Visit: Payer: Self-pay

## 2020-09-19 ENCOUNTER — Ambulatory Visit (HOSPITAL_COMMUNITY)
Admission: RE | Admit: 2020-09-19 | Discharge: 2020-09-19 | Disposition: A | Discharge status: Home / Self Care | Payer: Medicare Other | Source: Ambulatory Visit | Attending: Internal Medicine | Admitting: Internal Medicine

## 2020-09-19 LAB — POCT HEMOGLOBIN-HEMACUE: Hemoglobin: 12.3 g/dL (ref 12.0–15.0)

## 2020-09-19 MED ORDER — LIDOCAINE HCL (PF) 1 % IJ SOLN
INTRAMUSCULAR | Status: AC
  Filled 2020-09-19: qty 2

## 2020-09-23 ENCOUNTER — Ambulatory Visit (INDEPENDENT_AMBULATORY_CARE_PROVIDER_SITE_OTHER): Payer: Medicare Other | Admitting: *Deleted

## 2020-09-23 DIAGNOSIS — J309 Allergic rhinitis, unspecified: Secondary | ICD-10-CM

## 2020-10-09 ENCOUNTER — Ambulatory Visit (INDEPENDENT_AMBULATORY_CARE_PROVIDER_SITE_OTHER): Payer: Medicare Other | Admitting: *Deleted

## 2020-10-09 DIAGNOSIS — J309 Allergic rhinitis, unspecified: Secondary | ICD-10-CM

## 2020-10-17 ENCOUNTER — Encounter: Payer: Self-pay | Admitting: Neurology

## 2020-10-17 ENCOUNTER — Ambulatory Visit: Payer: Medicare Other | Admitting: Neurology

## 2020-10-17 VITALS — BP 163/93 | HR 97 | Ht 64.0 in | Wt 186.6 lb

## 2020-10-17 DIAGNOSIS — E1142 Type 2 diabetes mellitus with diabetic polyneuropathy: Secondary | ICD-10-CM | POA: Diagnosis not present

## 2020-10-17 DIAGNOSIS — G609 Hereditary and idiopathic neuropathy, unspecified: Secondary | ICD-10-CM | POA: Diagnosis not present

## 2020-10-17 DIAGNOSIS — E538 Deficiency of other specified B group vitamins: Secondary | ICD-10-CM | POA: Diagnosis not present

## 2020-10-17 HISTORY — DX: Type 2 diabetes mellitus with diabetic polyneuropathy: E11.42

## 2020-10-17 MED ORDER — GABAPENTIN 300 MG PO CAPS: ORAL_CAPSULE | ORAL | 3 refills | Status: DC

## 2020-10-17 NOTE — Progress Notes (Signed)
Reason for visit: Peripheral neuropathy  Referring physician: Dr. Jarrett Soho is a 70 y.o. female  History of present illness:  Miranda Stark is a 70 year old right-handed white female with a history of diabetes, her most recent hemoglobin A1c was 6.7.  She has a history of hemochromatosis as well.  She has had prior lumbosacral spine surgery done in May 2016, after surgery she has noted burning sensations in the distal portions of her feet bilaterally that has been persistent over time.  She was started on gabapentin taking 300 mg in the morning and 600 mg in the evening and 2019 after a nerve conduction study done was unremarkable.  This was done through the [facility].  The patient feels at this point that the gabapentin is not adequate to prevent discomfort at nighttime.  The patient has some discomfort during the day but this is not as bothersome to her.  She mainly has issues at night with sleeping.  In the past, she has had significant problems with cramps involving the right foot and leg, but this has not been an issue over the last 6 months.  The patient denies any weakness of the extremities or any problems with falls or severe balance issues although at times she feels that her balance is slightly off.  She denies issues controlling the bowels or the bladder, she does have some constipation issues at times.  She has some achy pain in the hands, but no numbness.  At times, she may take a half of a Vicodin tablet at night to help her rest.  She has a history of restless leg syndrome in the past, but not currently.  She is sent to this office for further evaluation.  Past Medical History:  Diagnosis Date   Allergic rhinitis    Anemia    prior to  hysterectomy   Anxiety    takes lorazepam for  prn anxiety   Cardiac arrhythmia due to congenital heart disease    pt denies   Complication of anesthesia    Depression    history of  depression while caring for ill  mother   Eczema    Fatty liver    Fibromyalgia    Gallstones    GERD (gastroesophageal reflux disease)    H/O colonoscopy with polypectomy    H/O seasonal allergies    Hemochromatosis    compound heterozygous   Hepatitis    drug induced elevated liver enzymes   History of recurrent UTI (urinary tract infection)    due to urethral stricture;; s/p dilatation 25 years ago   HTN (hypertension)    Insomnia    Obesity    Ovarian cyst    PONV (postoperative nausea and vomiting)    Sciatica     Past Surgical History:  Procedure Laterality Date   [facility]   COLONOSCOPY  02/03/11   diminutive adenoma and hemorrhoids   EXPLORATORY LAPAROTOMY   07/15/2010     Lysis of adhesions, Bilateral salpingo-oophorectomy.    LAPAROSCOPIC CHOLECYSTECTOMY  03/17/00   with intraoperative cholangiogram.   LAPAROSCOPIC OOPHORECTOMY     LAPAROSCOPY  1982, 1990   LUMBAR LAMINECTOMY/DECOMPRESSION MICRODISCECTOMY Right 08/15/2014   Procedure: LUMBAR LAMINECTOMY/DECOMPRESSION MICRODISCECTOMY RIGHT LUMBAR FOUR-FIVE AND ,EXTRAFORAMINAL EXPLORATION;  Surgeon: Jovita Gamma, MD;  Location: [facility];  Service: Neurosurgery;  Laterality: Right;   TONSILLECTOMY AND ADENOIDECTOMY  1965   UPPER GASTROINTESTINAL ENDOSCOPY  02/03/11  20 Fr Maloney dilation (looked normal but dysphagia)   WISDOM TOOTH EXTRACTION  1972    Family History  Problem Relation Age of Onset   Diabetes Mother        sister   Hypertension Mother        sister   Lung disease Mother    Osteoporosis Mother        brother   Basal cell carcinoma Mother    Mental illness Mother    Kidney failure Brother    Sleep apnea Sister        mother   Colon cancer Neg Hx     Social history:  reports that she quit smoking about 10 years ago. Her smoking use included cigarettes. She has a 1.60 pack-year smoking history. She has never used smokeless tobacco. She reports current alcohol use. She reports that  she does not use drugs.  Medications:  Prior to Admission medications   Medication Sig Start Date End Date Taking? Authorizing Provider  amphetamine-dextroamphetamine (ADDERALL) 5 MG tablet Take 5 mg by mouth as needed.    Yes [provider]  b complex vitamins capsule Take 1 capsule by mouth daily.   Yes [provider]  Calcium Carbonate Antacid (TUMS E-X 750 PO) Take 4 capsules by mouth daily.   Yes [provider]  Cholecalciferol (VITAMIN D3 PO) Vitamin D3 50 mcg (2,000 unit) capsule   1 capsule every day by oral route.   Yes [provider]  cyclobenzaprine (FLEXERIL) 10 MG tablet Take 10 mg by mouth at bedtime as needed (fibromyalgia).    Yes [provider]  EPINEPHrine 0.3 mg/0.3 mL IJ SOAJ injection Use for life threatening allergic reactions 09/01/18  Yes Padgett, Rae Halsted, MD  fluticasone [facility]) 50 MCG/ACT nasal spray Place 1 spray into both nostrils daily. Patient taking differently: Place 1 spray into both nostrils daily as needed. 04/28/18  Yes Padgett, Rae Halsted, MD  gabapentin (NEURONTIN) 300 MG capsule Take by mouth 2 (two) times daily. Takes '300mg'$  po am and '600mg'$  po pm   Yes [provider]  HYDROcodone-acetaminophen (NORCO/VICODIN) 5-325 MG per tablet Take 1-2 tablets by mouth every 4 (four) hours as needed (mild pain). 08/16/14  Yes Jovita Gamma, MD  ipratropium (ATROVENT) 0.06 % nasal spray 2 sprays 3-4 times as needed for nasal drainage. 03/08/19  Yes Padgett, Rae Halsted, MD  loratadine (CLARITIN) 10 MG tablet Take 10 mg by mouth daily as needed for allergies.   Yes [provider]  LORazepam (ATIVAN) 1 MG tablet Take 1 mg by mouth at bedtime as needed for sleep.    Yes [provider]  losartan (COZAAR) 50 MG tablet Take 50 mg by mouth daily.   Yes [provider]  Magnesium 500 MG TABS Take 500 mg by mouth every other day.   Yes [provider]  melatonin 5  MG TABS Take 5 mg by mouth at bedtime.   Yes [provider]  metFORMIN (GLUCOPHAGE) 500 MG tablet Take 500 mg by mouth daily.   Yes [provider]  VITAMIN E PO Take by mouth. 2000u daily   Yes [provider]  Zinc 30 MG CAPS Take 1 capsule by mouth daily at 6 (six) AM.   Yes [provider]      Allergies  Allergen Reactions   Actifed Cold-Allergy [Chlorpheniramine-Phenyleph Er] Other (See Comments)    tachycardia   Ampicillin Other (See Comments)    Causes yeast  Beef-Derived Products Other (See Comments)    Mood swings (no longer a problem ).     Benadryl [Diphenhydramine] Other (See Comments)    Restless legs   Darvocet [Propoxyphene N-Acetaminophen] Other (See Comments)    Floating sensation   Darvon Compound Other (See Comments)    Floating sensation   Ibuprofen     Fluid retention   Indomethacin Other (See Comments)    Diarrhea, burning sensation lower abd   Mobic [Meloxicam]     Significant fluid retention   Tofranil-Pm Other (See Comments)    Drug educed hepatitis   Ultram [Tramadol Hcl] Other (See Comments)    Severe syncope    ROS:  Out of a complete 14 system review of symptoms, the patient complains only of the following symptoms, and all other reviewed systems are negative.  Burning in the feet Hand discomfort  Blood pressure (!) 163/93, pulse 97, height '5\' 4"'$  (1.626 m), weight 186 lb 9.6 oz (84.6 kg).  Physical Exam  General: The patient is alert and cooperative at the time of the examination.  Eyes: Pupils are equal, round, and reactive to light. Discs are flat bilaterally.  Neck: The neck is supple, no carotid bruits are noted.  Respiratory: The respiratory examination is clear.  Cardiovascular: The cardiovascular examination reveals a regular rate and rhythm, no obvious murmurs or rubs are noted.  Skin: Extremities are without significant edema.  Neurologic Exam  Mental status: The patient is alert and  oriented x 3 at the time of the examination. The patient has apparent normal recent and remote memory, with an apparently normal attention span and concentration ability.  Cranial nerves: Facial symmetry is present. There is good sensation of the face to pinprick and soft touch bilaterally. The strength of the facial muscles and the muscles to head turning and shoulder shrug are normal bilaterally. Speech is well enunciated, no aphasia or dysarthria is noted. Extraocular movements are full. Visual fields are full. The tongue is midline, and the patient has symmetric elevation of the soft palate. No obvious hearing deficits are noted.  Motor: The motor testing reveals 5 over 5 strength of all 4 extremities. Good symmetric motor tone is noted throughout.  Sensory: Sensory testing is intact to pinprick, soft touch, vibration sensation, and position sense on all 4 extremities, with evidence of a stocking pattern sensory deficit to pinprick in the distal portions of the feet bilaterally. No evidence of extinction is noted.  Coordination: Cerebellar testing reveals good finger-nose-finger and heel-to-shin bilaterally.  Gait and station: Gait is normal. Tandem gait is normal. Romberg is negative. No drift is seen.  Reflexes: Deep tendon reflexes are symmetric, but are depressed bilaterally. Toes are downgoing bilaterally.   Assessment/Plan:  1.  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  Prior nerve conduction studies done in 2019 were normal, 3 years after onset of symptoms.  The patient may very well have a small fiber neuropathy.  The gabapentin will be increased taking 300 mg in the morning and 900 mg in the evening.  A prescription was sent in.  If this is not effective, Cymbalta will be added to the regimen.  She will follow-up here in 6 months.  In the future, she can be followed through Dr. Krista Blue.  Miranda Alexanders  MD 10/17/2020 11:43 AM  [facility] [address] [place], [place] [address]  Phone [phone] Fax [phone]

## 2020-10-17 NOTE — Patient Instructions (Signed)
Increase gabapentin to 300 mg in the morning and 900 mg in the evening.  Try alpha lipoic acid 600 mg a day.

## 2020-10-23 ENCOUNTER — Other Ambulatory Visit: Payer: Self-pay | Admitting: *Deleted

## 2020-10-23 ENCOUNTER — Ambulatory Visit (INDEPENDENT_AMBULATORY_CARE_PROVIDER_SITE_OTHER): Payer: Medicare Other | Admitting: *Deleted

## 2020-10-23 DIAGNOSIS — J309 Allergic rhinitis, unspecified: Secondary | ICD-10-CM

## 2020-10-23 LAB — MULTIPLE MYELOMA PANEL, SERUM
Albumin SerPl Elph-Mcnc: 4 g/dL (ref 2.9–4.4)
Albumin/Glob SerPl: 1.3 (ref 0.7–1.7)
Alpha 1: 0.2 g/dL (ref 0.0–0.4)
Alpha2 Glob SerPl Elph-Mcnc: 0.8 g/dL (ref 0.4–1.0)
B-Globulin SerPl Elph-Mcnc: 1.1 g/dL (ref 0.7–1.3)
Gamma Glob SerPl Elph-Mcnc: 1.1 g/dL (ref 0.4–1.8)
Globulin, Total: 3.2 g/dL (ref 2.2–3.9)
IgA/Immunoglobulin A, Serum: 183 mg/dL (ref 87–352)
IgG (Immunoglobin G), Serum: 1041 mg/dL (ref 586–1602)
IgM (Immunoglobulin M), Srm: 82 mg/dL (ref 26–217)
Total Protein: 7.2 g/dL (ref 6.0–8.5)

## 2020-10-23 LAB — COPPER, SERUM: Copper: 116 ug/dL (ref 80–158)

## 2020-10-23 LAB — ANA W/REFLEX: Anti Nuclear Antibody (ANA): NEGATIVE

## 2020-10-23 LAB — VITAMIN D 25 HYDROXY (VIT D DEFICIENCY, FRACTURES): Vit D, 25-Hydroxy: 40.3 ng/mL (ref 30.0–100.0)

## 2020-10-23 LAB — ANGIOTENSIN CONVERTING ENZYME: Angio Convert Enzyme: 40 U/L (ref 14–82)

## 2020-10-23 LAB — VITAMIN B12: Vitamin B-12: 1145 pg/mL (ref 232–1245)

## 2020-10-23 LAB — SEDIMENTATION RATE: Sed Rate: 6 mm/hr (ref 0–40)

## 2020-10-23 LAB — LYME DISEASE SEROLOGY W/REFLEX: Lyme Total Antibody EIA: NEGATIVE

## 2020-10-23 MED ORDER — IPRATROPIUM BROMIDE 0.06 % NA SOLN: NASAL | 5 refills | Status: DC

## 2020-10-30 ENCOUNTER — Ambulatory Visit: Payer: Medicare Other | Admitting: Neurology

## 2020-11-07 ENCOUNTER — Ambulatory Visit (INDEPENDENT_AMBULATORY_CARE_PROVIDER_SITE_OTHER): Payer: Medicare Other

## 2020-11-07 DIAGNOSIS — J309 Allergic rhinitis, unspecified: Secondary | ICD-10-CM | POA: Diagnosis not present

## 2020-11-18 DIAGNOSIS — J3081 Allergic rhinitis due to animal (cat) (dog) hair and dander: Secondary | ICD-10-CM

## 2020-11-18 NOTE — Progress Notes (Signed)
VIALS MADE. EXP 11-18-21 

## 2020-11-19 DIAGNOSIS — J3089 Other allergic rhinitis: Secondary | ICD-10-CM

## 2020-11-21 ENCOUNTER — Ambulatory Visit (INDEPENDENT_AMBULATORY_CARE_PROVIDER_SITE_OTHER): Payer: Medicare Other | Admitting: *Deleted

## 2020-11-21 DIAGNOSIS — J309 Allergic rhinitis, unspecified: Secondary | ICD-10-CM

## 2020-12-11 ENCOUNTER — Ambulatory Visit (INDEPENDENT_AMBULATORY_CARE_PROVIDER_SITE_OTHER): Payer: Medicare Other | Admitting: *Deleted

## 2020-12-11 DIAGNOSIS — J309 Allergic rhinitis, unspecified: Secondary | ICD-10-CM | POA: Diagnosis not present

## 2020-12-12 ENCOUNTER — Other Ambulatory Visit: Payer: Self-pay

## 2020-12-12 ENCOUNTER — Ambulatory Visit (HOSPITAL_COMMUNITY)
Admission: RE | Admit: 2020-12-12 | Discharge: 2020-12-12 | Disposition: A | Discharge status: Home / Self Care | Payer: Medicare Other | Source: Ambulatory Visit | Attending: Internal Medicine | Admitting: Internal Medicine

## 2020-12-12 LAB — POCT HEMOGLOBIN-HEMACUE: Hemoglobin: 14.4 g/dL (ref 12.0–15.0)

## 2021-01-01 ENCOUNTER — Ambulatory Visit (INDEPENDENT_AMBULATORY_CARE_PROVIDER_SITE_OTHER): Payer: Medicare Other | Admitting: *Deleted

## 2021-01-01 DIAGNOSIS — J309 Allergic rhinitis, unspecified: Secondary | ICD-10-CM

## 2021-01-20 ENCOUNTER — Ambulatory Visit (INDEPENDENT_AMBULATORY_CARE_PROVIDER_SITE_OTHER): Payer: Medicare Other | Admitting: *Deleted

## 2021-01-20 DIAGNOSIS — J309 Allergic rhinitis, unspecified: Secondary | ICD-10-CM | POA: Diagnosis not present

## 2021-01-29 ENCOUNTER — Ambulatory Visit (INDEPENDENT_AMBULATORY_CARE_PROVIDER_SITE_OTHER): Payer: Medicare Other | Admitting: *Deleted

## 2021-01-29 DIAGNOSIS — J309 Allergic rhinitis, unspecified: Secondary | ICD-10-CM | POA: Diagnosis not present

## 2021-02-05 ENCOUNTER — Ambulatory Visit (INDEPENDENT_AMBULATORY_CARE_PROVIDER_SITE_OTHER): Payer: Medicare Other

## 2021-02-05 DIAGNOSIS — J309 Allergic rhinitis, unspecified: Secondary | ICD-10-CM | POA: Diagnosis not present

## 2021-02-12 ENCOUNTER — Ambulatory Visit (INDEPENDENT_AMBULATORY_CARE_PROVIDER_SITE_OTHER): Payer: Medicare Other | Admitting: *Deleted

## 2021-02-12 DIAGNOSIS — J309 Allergic rhinitis, unspecified: Secondary | ICD-10-CM

## 2021-02-16 ENCOUNTER — Ambulatory Visit (INDEPENDENT_AMBULATORY_CARE_PROVIDER_SITE_OTHER): Payer: Medicare Other | Admitting: *Deleted

## 2021-02-16 DIAGNOSIS — J309 Allergic rhinitis, unspecified: Secondary | ICD-10-CM | POA: Diagnosis not present

## 2021-03-12 ENCOUNTER — Ambulatory Visit (INDEPENDENT_AMBULATORY_CARE_PROVIDER_SITE_OTHER): Payer: Medicare Other

## 2021-03-12 DIAGNOSIS — J309 Allergic rhinitis, unspecified: Secondary | ICD-10-CM

## 2021-03-31 ENCOUNTER — Ambulatory Visit (INDEPENDENT_AMBULATORY_CARE_PROVIDER_SITE_OTHER): Payer: Medicare Other | Admitting: *Deleted

## 2021-03-31 DIAGNOSIS — J309 Allergic rhinitis, unspecified: Secondary | ICD-10-CM | POA: Diagnosis not present

## 2021-04-09 ENCOUNTER — Other Ambulatory Visit (HOSPITAL_COMMUNITY): Payer: Self-pay | Admitting: *Deleted

## 2021-04-10 ENCOUNTER — Other Ambulatory Visit: Payer: Self-pay

## 2021-04-10 ENCOUNTER — Ambulatory Visit (HOSPITAL_COMMUNITY)
Admission: RE | Admit: 2021-04-10 | Discharge: 2021-04-10 | Disposition: A | Discharge status: Home / Self Care | Payer: Medicare Other | Source: Ambulatory Visit | Attending: Registered Nurse | Admitting: Registered Nurse

## 2021-04-10 LAB — POCT HEMOGLOBIN-HEMACUE: Hemoglobin: 14.1 g/dL (ref 12.0–15.0)

## 2021-04-20 ENCOUNTER — Ambulatory Visit: Payer: Medicare Other | Admitting: Family Medicine

## 2021-04-21 ENCOUNTER — Ambulatory Visit (INDEPENDENT_AMBULATORY_CARE_PROVIDER_SITE_OTHER): Payer: Medicare Other

## 2021-04-21 DIAGNOSIS — J309 Allergic rhinitis, unspecified: Secondary | ICD-10-CM | POA: Diagnosis not present

## 2021-04-22 NOTE — Progress Notes (Signed)
Chief Complaint  Patient presents with   Follow-up    Pt alone, rm 2. States overall things are the same.      HISTORY OF PRESENT ILLNESS:  04/23/21 ALL:  Miranda Stark is a 71 y.o. female here today for follow up for neuropathy. Dr Miranda Stark increased her gabapentin to 300mg  in am and 900mg  at bedtime at consult in 09/2020. Symptoms are improved. She is also taking alpha lipoic acid. She continues to have waxing and waning burning. Heat and Building services engineer work well for cramps.    HISTORY (copied from Dr Miranda Stark' previous note)  Miranda Stark is a 71 year old right-handed white female with a history of diabetes, her most recent hemoglobin A1c was 6.7.  She has a history of hemochromatosis as well.  She has had prior lumbosacral spine surgery done in May 2016, after surgery she has noted burning sensations in the distal portions of her feet bilaterally that has been persistent over time.  She was started on gabapentin taking 300 mg in the morning and 600 mg in the evening and 2019 after a nerve conduction study done was unremarkable.  This was done through the [facility]. The patient feels at this point that the gabapentin is not adequate to prevent discomfort at nighttime.  The patient has some discomfort during the day but this is not as bothersome to her.  She mainly has issues at night with sleeping.  In the past, she has had significant problems with cramps involving the right foot and leg, but this has not been an issue over the last 6 months.  The patient denies any weakness of the extremities or any problems with falls or severe balance issues although at times she feels that her balance is slightly off.  She denies issues controlling the bowels or the bladder, she does have some constipation issues at times.  She has some achy pain in the hands, but no numbness.  At times, she may take a half of a Vicodin tablet at night to help her rest.  She has a history of restless leg  syndrome in the past, but not currently.  She is sent to this office for further evaluation.   REVIEW OF SYSTEMS: Out of a complete 14 system review of symptoms, the patient complains only of the following symptoms, numbness and tingling of feet, leg cramps and all other reviewed systems are negative.   ALLERGIES: Allergies  Allergen Reactions   Actifed Cold-Allergy [Chlorpheniramine-Phenyleph Er] Other (See Comments)    tachycardia   Ampicillin Other (See Comments)    Causes yeast   Beef-Derived Products Other (See Comments)    Mood swings (no longer a problem ).     Benadryl [Diphenhydramine] Other (See Comments)    Restless legs   Darvocet [Propoxyphene N-Acetaminophen] Other (See Comments)    Floating sensation   Darvon Compound Other (See Comments)    Floating sensation   Ibuprofen     Fluid retention   Indomethacin Other (See Comments)    Diarrhea, burning sensation lower abd   Mobic [Meloxicam]     Significant fluid retention   Tofranil-Pm Other (See Comments)    Drug educed hepatitis   Ultram [Tramadol Hcl] Other (See Comments)    Severe syncope     HOME MEDICATIONS: Outpatient Medications Prior to Visit  Medication Sig Dispense Refill   amphetamine-dextroamphetamine (ADDERALL) 5 MG tablet Take 5 mg by mouth as needed.      b complex vitamins  capsule Take 1 capsule by mouth daily.     Calcium Carbonate Antacid (TUMS E-X 750 PO) Take 4 capsules by mouth daily.     Cholecalciferol (VITAMIN D) 125 MCG (5000 UT) CAPS      cyclobenzaprine (FLEXERIL) 10 MG tablet Take 10 mg by mouth at bedtime as needed (fibromyalgia).      EPINEPHrine 0.3 mg/0.3 mL IJ SOAJ injection Use for life threatening allergic reactions 1 Device 1   gabapentin (NEURONTIN) 300 MG capsule Takes 300mg  po am and 900 mg po pm 360 capsule 3   HYDROcodone-acetaminophen (NORCO/VICODIN) 5-325 MG per tablet Take 1-2 tablets by mouth every 4 (four) hours as needed (mild pain). 60 tablet 0   ipratropium  (ATROVENT) 0.06 % nasal spray 2 sprays 3-4 times as needed for nasal drainage. 15 mL 5   loratadine (CLARITIN) 10 MG tablet Take 10 mg by mouth daily as needed for allergies.     LORazepam (ATIVAN) 1 MG tablet Take 1 mg by mouth at bedtime as needed for sleep.      losartan (COZAAR) 50 MG tablet Take 50 mg by mouth daily.     Magnesium 500 MG TABS Take 500 mg by mouth every other day.     melatonin 5 MG TABS Take 5 mg by mouth at bedtime.     metFORMIN (GLUCOPHAGE-XR) 500 MG 24 hr tablet SMARTSIG:2 Tablet(s) By Mouth Every Evening     vitamin E 180 MG (400 UNITS) capsule      Zinc 30 MG CAPS Take 1 capsule by mouth daily at 6 (six) AM.     Cholecalciferol (VITAMIN D3 PO) Vitamin D3 50 mcg (2,000 unit) capsule   1 capsule every day by oral route.     metFORMIN (GLUCOPHAGE) 500 MG tablet Take 500 mg by mouth daily.     VITAMIN E PO Take by mouth. 2000u daily     No facility-administered medications prior to visit.     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 heart disease    pt denies   Complication of anesthesia    Depression    history of  depression while caring for ill mother   Diabetic peripheral neuropathy ([place]) 10/17/2020   Eczema    Fatty liver    Fibromyalgia    Gallstones    GERD (gastroesophageal reflux disease)    H/O colonoscopy with polypectomy    H/O seasonal allergies    Hemochromatosis    compound heterozygous   Hepatitis    drug induced elevated liver enzymes   History of recurrent UTI (urinary tract infection)    due to urethral stricture;; s/p dilatation 25 years ago   HTN (hypertension)    Insomnia    Obesity    Ovarian cyst    PONV (postoperative nausea and vomiting)    Sciatica      PAST SURGICAL HISTORY: Past Surgical History:  Procedure Laterality Date   [place]   COLONOSCOPY  02/03/11   diminutive adenoma and hemorrhoids   EXPLORATORY LAPAROTOMY   07/15/2010     Lysis of adhesions, Bilateral salpingo-oophorectomy.    LAPAROSCOPIC CHOLECYSTECTOMY  03/17/00   with intraoperative cholangiogram.   [place]  LUMBAR LAMINECTOMY/DECOMPRESSION MICRODISCECTOMY Right 08/15/2014   Procedure: LUMBAR LAMINECTOMY/DECOMPRESSION MICRODISCECTOMY RIGHT LUMBAR FOUR-FIVE AND ,EXTRAFORAMINAL EXPLORATION;  Surgeon: Jovita Gamma, MD;  Location: [facility];  Service: Neurosurgery;  Laterality: Right;   TONSILLECTOMY AND ADENOIDECTOMY  1965   UPPER GASTROINTESTINAL ENDOSCOPY  02/03/11   53 [address] dilation (looked normal but dysphagia)   WISDOM TOOTH EXTRACTION  1972     FAMILY HISTORY: Family History  Problem Relation Age of Onset   Diabetes Mother        sister   Hypertension Mother        sister   Lung disease Mother    Osteoporosis Mother        brother   Basal cell carcinoma Mother    Mental illness Mother    Kidney failure Brother    Sleep apnea Sister        mother   Colon cancer Neg Hx      SOCIAL HISTORY: Social History   Socioeconomic History   Marital status: Married    Spouse name: Not on file   Number of children: 1   Years of education: Not on file   Highest education level: Not on file  Occupational History   Occupation: retired  Tobacco Use   Smoking status: Former    Packs/day: 0.20    Years: 8.00    Pack years: 1.60    Types: Cigarettes    Quit date: 03/29/2010    Years since quitting: 11.0   Smokeless tobacco: Never  Vaping Use   Vaping Use: Never used  Substance and Sexual Activity   Alcohol use: Yes    Comment: ocassionaly   Drug use: No   Sexual activity: Not Currently  Other Topics Concern   Not on file  Social History Narrative   1-2 caffeine drinks a day   Social Determinants of Health   Financial Resource Strain: Not on file  Food  Insecurity: Not on file  Transportation Needs: Not on file  Physical Activity: Not on file  Stress: Not on file  Social Connections: Not on file  Intimate Partner Violence: Not on file     PHYSICAL EXAM  Vitals:   04/23/21 1115  BP: 138/84  Pulse: (!) 102  Weight: 177 lb (80.3 kg)  Height: 5\' 4"  (1.626 m)   Body mass index is 30.38 kg/m.  Generalized: Well developed, in no acute distress  Cardiology: normal rate and rhythm, no murmur auscultated  Respiratory: clear to auscultation bilaterally    Neurological examination  Mentation: Alert oriented to time, place, history taking. Follows all commands speech and language fluent Cranial nerve II-XII: Pupils were equal round reactive to light. Extraocular movements were full, visual field were full on confrontational test. Facial sensation and strength were normal. Head turning and shoulder shrug  were normal and symmetric. Motor: The motor testing reveals 5 over 5 strength of all 4 extremities. Good symmetric motor tone is noted throughout.  Sensory: Sensory testing is intact to soft touch on all 4 extremities. No evidence of extinction is noted.  Coordination: Cerebellar testing reveals good finger-nose-finger and heel-to-shin bilaterally.  Gait and station: Gait is normal.  Reflexes: Deep tendon reflexes are symmetric and normal bilaterally.    DIAGNOSTIC DATA (LABS, IMAGING, TESTING) - I reviewed patient records, labs, notes, testing and imaging myself where available.  Lab Results  Component Value Date   WBC 6.7 08/13/2014   HGB 14.1 04/10/2021   HCT 40.7 08/13/2014   MCV 91.7 08/13/2014  PLT 183 08/13/2014      Component Value Date/Time   NA 143 08/13/2014 1507   K 4.2 08/13/2014 1507   CL 108 08/13/2014 1507   CO2 27 08/13/2014 1507   GLUCOSE 122 (H) 08/13/2014 1507   BUN 13 08/13/2014 1507   CREATININE 0.65 08/13/2014 1507   CALCIUM 9.8 08/13/2014 1507   PROT 7.2 10/17/2020 1218   ALBUMIN 3.8 07/08/2010  1225   AST 42 (H) 07/08/2010 1225   ALT 35 07/08/2010 1225   ALKPHOS 45 07/08/2010 1225   BILITOT 0.4 07/08/2010 1225   GFRNONAA >60 08/13/2014 1507   GFRAA >60 08/13/2014 1507   No results found for: CHOL, HDL, LDLCALC, LDLDIRECT, TRIG, CHOLHDL No results found for: HGBA1C Lab Results  Component Value Date   VITAMINB12 1,145 10/17/2020   No results found for: TSH  No flowsheet data found.   No flowsheet data found.   ASSESSMENT AND PLAN  72 y.o. year old female  has a past medical history of Allergic rhinitis, Anemia, Anxiety, Cardiac arrhythmia due to congenital heart disease, Complication of anesthesia, Depression, Diabetic peripheral neuropathy ([place]) (10/17/2020), Eczema, Fatty liver, Fibromyalgia, Gallstones, GERD (gastroesophageal reflux disease), H/O colonoscopy with polypectomy, H/O seasonal allergies, Hemochromatosis, Hepatitis, History of recurrent UTI (urinary tract infection), HTN (hypertension), Insomnia, Obesity, Ovarian cyst, PONV (postoperative nausea and vomiting), and Sciatica. here with    Diabetic peripheral neuropathy (H[facility])  Tereka is doing well, today. We will continue gabapentin 300mg  in the morning and 900mg  at bedtime. She will continue alpha lipoic acid and complementary therapies if beneficial. Healthy lifestyle habits encouraged. She will follow up with me in 1 year, sooner if needed.    No orders of the defined types were placed in this encounter.    No orders of the defined types were placed in this encounter.     Debbora Presto, MSN, FNP-C 04/23/2021, 11:48 AM  [facility] [address], [place], [place] [address] [phone]

## 2021-04-22 NOTE — Patient Instructions (Addendum)
Below is our plan:  We will continue gabapentin to 300mg  in am and 900mg  at bedtime.   Continue the alpha lipoic acid if you find this beneficial.  You may also try capsaicin or lidocaine OTC cream to the affected extremity as needed.   Please make sure you are staying well hydrated. I recommend 50-60 ounces daily. Well balanced diet and regular exercise encouraged. Consistent sleep schedule with 6-8 hours recommended.   Please continue follow up with care team as directed.  Follow up with me  in 1 year or sooner if needed.  You may receive a survey regarding today's visit. I encourage you to leave honest feed back as I do use this information to improve patient care. Thank you for seeing me today!

## 2021-04-23 ENCOUNTER — Encounter: Payer: Self-pay | Admitting: Family Medicine

## 2021-04-23 ENCOUNTER — Ambulatory Visit: Payer: Medicare Other | Admitting: Family Medicine

## 2021-04-23 ENCOUNTER — Other Ambulatory Visit: Payer: Self-pay

## 2021-04-23 VITALS — BP 138/84 | HR 102 | Ht 64.0 in | Wt 177.0 lb

## 2021-04-23 DIAGNOSIS — E1142 Type 2 diabetes mellitus with diabetic polyneuropathy: Secondary | ICD-10-CM

## 2021-05-12 ENCOUNTER — Other Ambulatory Visit: Payer: Self-pay | Admitting: *Deleted

## 2021-05-12 ENCOUNTER — Ambulatory Visit (INDEPENDENT_AMBULATORY_CARE_PROVIDER_SITE_OTHER): Payer: Medicare Other | Admitting: *Deleted

## 2021-05-12 DIAGNOSIS — J309 Allergic rhinitis, unspecified: Secondary | ICD-10-CM | POA: Diagnosis not present

## 2021-05-12 MED ORDER — EPINEPHRINE 0.3 MG/0.3ML IJ SOAJ: INTRAMUSCULAR | 1 refills | Status: DC

## 2021-05-13 DIAGNOSIS — J3081 Allergic rhinitis due to animal (cat) (dog) hair and dander: Secondary | ICD-10-CM | POA: Diagnosis not present

## 2021-05-13 NOTE — Progress Notes (Signed)
VIALS EXP 05-13-22

## 2021-05-14 DIAGNOSIS — J3089 Other allergic rhinitis: Secondary | ICD-10-CM | POA: Diagnosis not present

## 2021-06-03 ENCOUNTER — Ambulatory Visit (INDEPENDENT_AMBULATORY_CARE_PROVIDER_SITE_OTHER): Payer: Medicare Other

## 2021-06-03 DIAGNOSIS — J309 Allergic rhinitis, unspecified: Secondary | ICD-10-CM

## 2021-06-25 ENCOUNTER — Ambulatory Visit (INDEPENDENT_AMBULATORY_CARE_PROVIDER_SITE_OTHER): Payer: Medicare Other

## 2021-06-25 DIAGNOSIS — J309 Allergic rhinitis, unspecified: Secondary | ICD-10-CM

## 2021-07-14 ENCOUNTER — Ambulatory Visit (INDEPENDENT_AMBULATORY_CARE_PROVIDER_SITE_OTHER): Payer: Medicare Other

## 2021-07-14 DIAGNOSIS — J309 Allergic rhinitis, unspecified: Secondary | ICD-10-CM | POA: Diagnosis not present

## 2021-07-21 ENCOUNTER — Ambulatory Visit (INDEPENDENT_AMBULATORY_CARE_PROVIDER_SITE_OTHER): Payer: Medicare Other

## 2021-07-21 DIAGNOSIS — J309 Allergic rhinitis, unspecified: Secondary | ICD-10-CM

## 2021-07-30 ENCOUNTER — Ambulatory Visit (INDEPENDENT_AMBULATORY_CARE_PROVIDER_SITE_OTHER): Payer: Medicare Other

## 2021-07-30 ENCOUNTER — Other Ambulatory Visit (HOSPITAL_COMMUNITY): Payer: Self-pay | Admitting: *Deleted

## 2021-07-30 DIAGNOSIS — J309 Allergic rhinitis, unspecified: Secondary | ICD-10-CM | POA: Diagnosis not present

## 2021-07-31 ENCOUNTER — Ambulatory Visit (HOSPITAL_COMMUNITY)
Admission: RE | Admit: 2021-07-31 | Discharge: 2021-07-31 | Disposition: A | Discharge status: Home / Self Care | Payer: Medicare Other | Source: Ambulatory Visit | Attending: Registered Nurse | Admitting: Registered Nurse

## 2021-07-31 DIAGNOSIS — E1142 Type 2 diabetes mellitus with diabetic polyneuropathy: Secondary | ICD-10-CM | POA: Insufficient documentation

## 2021-07-31 LAB — POCT HEMOGLOBIN-HEMACUE: Hemoglobin: 15 g/dL (ref 12.0–15.0)

## 2021-08-06 ENCOUNTER — Ambulatory Visit (INDEPENDENT_AMBULATORY_CARE_PROVIDER_SITE_OTHER): Payer: Medicare Other

## 2021-08-06 DIAGNOSIS — J309 Allergic rhinitis, unspecified: Secondary | ICD-10-CM | POA: Diagnosis not present

## 2021-08-13 ENCOUNTER — Ambulatory Visit (INDEPENDENT_AMBULATORY_CARE_PROVIDER_SITE_OTHER): Payer: Medicare Other

## 2021-08-13 DIAGNOSIS — J309 Allergic rhinitis, unspecified: Secondary | ICD-10-CM | POA: Diagnosis not present

## 2021-08-20 ENCOUNTER — Ambulatory Visit (INDEPENDENT_AMBULATORY_CARE_PROVIDER_SITE_OTHER): Payer: Medicare Other

## 2021-08-20 DIAGNOSIS — J309 Allergic rhinitis, unspecified: Secondary | ICD-10-CM

## 2021-09-04 ENCOUNTER — Ambulatory Visit: Payer: Medicare Other | Admitting: Allergy

## 2021-09-04 ENCOUNTER — Encounter: Payer: Self-pay | Admitting: Allergy

## 2021-09-04 VITALS — BP 110/62 | HR 94 | Resp 18

## 2021-09-04 DIAGNOSIS — J3089 Other allergic rhinitis: Secondary | ICD-10-CM

## 2021-09-04 DIAGNOSIS — J309 Allergic rhinitis, unspecified: Secondary | ICD-10-CM

## 2021-09-04 DIAGNOSIS — T781XXD Other adverse food reactions, not elsewhere classified, subsequent encounter: Secondary | ICD-10-CM

## 2021-09-04 NOTE — Patient Instructions (Addendum)
Allergic rhinitis  - continue avoidance measures for weed pollen, tree pollen, cat, dust mite and mold  - continue Claritin as needed.   - continue nasal Atrovent 2 sprays each nostril twice a day as needed for nasal drainage.    - continue nasal steroid spray like Nasacort or Flonase 2 sprays daily for nasal congestion as needed for 1-2 weeks at time before stopping once symptoms improve.    - continue allergen immunotherapy (allergy shots) per protocol for another 6 months.  Carry your epinephrine device on days of your injection.  Discussed stopping immunotherapy this winter.  We will plan for her last injections to be in December 2023.  She has been advised to let us know how she does in 2024 to determine if we can continue off or if we may need to resume for extension.  Pollen food allergy syndrome  - The oral allergy syndrome (OAS) or pollen-food allergy syndrome (PFAS) is a relatively common form of food allergy, particularly in adults. It typically occurs in people who have pollen allergies when the immune system "sees" proteins on the food that look like proteins on the pollen. This results in the allergy antibody (IgE) binding to the food instead of the pollen. Patients typically report itching and/or mild swelling of the mouth and throat immediately following ingestion of certain uncooked fruits (including nuts) or raw vegetables. Only a very small number of affected individuals experience systemic allergic reactions, such as anaphylaxis which occurs with true food allergies.    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  - she has been able to tolerate pineapple on most occasions without issue thus this is a great sign that she has lost her pollen sensitivity   Follow-up 12 months or sooner if needed

## 2021-09-04 NOTE — Progress Notes (Signed)
Follow-up Note  RE: Miranda Stark MRN: [id number] DOB: 08-07-1950 Date of Office Visit: 09/04/2021   History of present illness: Miranda Stark is a 71 y.o. female presenting today for follow-up of allergic rhinitis and pollen food allergy syndrome.  She was last seen in the office on 09/04/2020 by myself.  She has not had any major health changes, surgeries or hospitalizations since this visit. She states she has been doing good since being on allergen immunotherapy. Right now she is noting some nasal drainage and stinging eyes over the past several days.    She is using nasal atrovent 2 sprays each nostril that she started taking yesterday.  Using lubricating eye drop currently.   She is still taking Claritin as well.  She continues to get immunotherapy monthly tolerating well at this time without any large local or systemic reactions.  She states overall her allergy symptoms are much better controlled as she used to just be sick all the time and she has not been more. She did try pineapple and does eat it quite often in mixed fruit. She states there was only once she did note funny feeling in her mouth but it was much milder than the sensation she used to get prior to immunotherapy. She does still have access to her epinephrine device for her immunotherapy.  Review of systems: Review of Systems  Constitutional: Negative.   HENT:         See HPI  Eyes:        See HPI  Respiratory: Negative.    Cardiovascular: Negative.   Gastrointestinal: Negative.   Musculoskeletal: Negative.   Skin: Negative.   Allergic/Immunologic: Negative.   Neurological: Negative.      All other systems negative unless noted above in HPI  Past medical/social/surgical/family history have been reviewed and are unchanged unless specifically indicated below.  No changes  Medication List: Current Outpatient Medications  Medication Sig Dispense Refill   Alpha-Lipoic Acid 300 MG CAPS       amphetamine-dextroamphetamine (ADDERALL) 5 MG tablet Take 5 mg by mouth as needed.      b complex vitamins capsule Take 1 capsule by mouth daily.     Calcium Carbonate Antacid (TUMS E-X 750 PO) Take 4 capsules by mouth daily.     Cholecalciferol (VITAMIN D) 125 MCG (5000 UT) CAPS      cyclobenzaprine (FLEXERIL) 10 MG tablet Take 10 mg by mouth at bedtime as needed (fibromyalgia).      EPINEPHrine 0.3 mg/0.3 mL IJ SOAJ injection Use for life threatening allergic reactions 1 each 1   gabapentin (NEURONTIN) 300 MG capsule Takes '300mg'$  po am and 900 mg po pm 360 capsule 3   HYDROcodone-acetaminophen (NORCO/VICODIN) 5-325 MG per tablet Take 1-2 tablets by mouth every 4 (four) hours as needed (mild pain). 60 tablet 0   ipratropium (ATROVENT) 0.06 % nasal spray 2 sprays 3-4 times as needed for nasal drainage. 15 mL 5   loratadine (CLARITIN) 10 MG tablet Take 10 mg by mouth daily as needed for allergies.     LORazepam (ATIVAN) 1 MG tablet Take 1 mg by mouth at bedtime as needed for sleep.      losartan (COZAAR) 50 MG tablet Take 50 mg by mouth daily.     Magnesium 500 MG TABS Take 500 mg by mouth every other day.     melatonin 5 MG TABS Take 5 mg by mouth at bedtime.     metFORMIN (GLUCOPHAGE-XR) 500 MG  24 hr tablet SMARTSIG:2 Tablet(s) By Mouth Every Evening     Multiple Vitamins-Minerals (LUTEIN-ZEAXANTHIN PO)      vitamin E 180 MG (400 UNITS) capsule      Zinc 30 MG CAPS Take 1 capsule by mouth daily at 6 (six) AM.     No current facility-administered medications for this visit.     Known medication allergies: Allergies  Allergen Reactions   Actifed Cold-Allergy [Chlorpheniramine-Phenyleph Er] Other (See Comments)    tachycardia   Ampicillin Other (See Comments)    Causes yeast   Beef-Derived Products Other (See Comments)    Mood swings (no longer a problem ).     Benadryl [Diphenhydramine] Other (See Comments)    Restless legs   Darvocet [Propoxyphene N-Acetaminophen] Other (See Comments)     Floating sensation   Darvon Compound Other (See Comments)    Floating sensation   Ibuprofen     Fluid retention   Indomethacin Other (See Comments)    Diarrhea, burning sensation lower abd   Mobic [Meloxicam]     Significant fluid retention   Tofranil-Pm Other (See Comments)    Drug educed hepatitis   Ultram [Tramadol Hcl] Other (See Comments)    Severe syncope     Physical examination: Blood pressure 110/62, pulse 94, resp. rate 18.  General: Alert, interactive, in no acute distress. HEENT: PERRLA, TMs pearly gray, turbinates non-edematous without discharge, post-pharynx non erythematous. Neck: Supple without lymphadenopathy. Lungs: Clear to auscultation without wheezing, rhonchi or rales. {no increased work of breathing. CV: Normal S1, S2 without murmurs. Abdomen: Nondistended, nontender. Skin: Warm and dry, without lesions or rashes. Extremities:  No clubbing, cyanosis or edema. Neuro:   Grossly intact.  Diagnositics/Labs: Immunotherapy injections given in each arm today  Assessment and plan: Allergic rhinitis  - continue avoidance measures for weed pollen, tree pollen, cat, dust mite and mold  - continue Claritin as needed.   - continue nasal Atrovent 2 sprays each nostril twice a day as needed for nasal drainage.    - continue nasal steroid spray like Nasacort or Flonase 2 sprays daily for nasal congestion as needed for 1-2 weeks at time before stopping once symptoms improve.    - continue allergen immunotherapy (allergy shots) per protocol for another 6 months.  Carry your epinephrine device on days of your injection.  Discussed stopping immunotherapy this winter.  We will plan for her last injections to be in December 2023.  She has been advised to let us know how she does in 2024 to determine if we can continue off or if we may need to resume for extension.  Pollen food allergy syndrome  - The oral allergy syndrome (OAS) or pollen-food allergy syndrome (PFAS) is a  relatively common form of food allergy, particularly in adults. It typically occurs in people who have pollen allergies when the immune system "sees" proteins on the food that look like proteins on the pollen. This results in the allergy antibody (IgE) binding to the food instead of the pollen. Patients typically report itching and/or mild swelling of the mouth and throat immediately following ingestion of certain uncooked fruits (including nuts) or raw vegetables. Only a very small number of affected individuals experience systemic allergic reactions, such as anaphylaxis which occurs with true food allergies.    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  - she has been able to tolerate pineapple on most occasions without issue thus this is a great sign that she  has lost her pollen sensitivity   Follow-up 12 months or sooner if needed  I appreciate the opportunity to take part in Opaline's care. Please do not hesitate to contact me with questions.  Sincerely,   Prudy Feeler, MD Allergy/Immunology [facility]

## 2021-09-24 ENCOUNTER — Ambulatory Visit (INDEPENDENT_AMBULATORY_CARE_PROVIDER_SITE_OTHER): Payer: Medicare Other

## 2021-09-24 DIAGNOSIS — J309 Allergic rhinitis, unspecified: Secondary | ICD-10-CM

## 2021-10-16 ENCOUNTER — Ambulatory Visit (INDEPENDENT_AMBULATORY_CARE_PROVIDER_SITE_OTHER): Payer: Medicare Other | Admitting: *Deleted

## 2021-10-16 DIAGNOSIS — J309 Allergic rhinitis, unspecified: Secondary | ICD-10-CM | POA: Diagnosis not present

## 2021-11-05 ENCOUNTER — Ambulatory Visit (INDEPENDENT_AMBULATORY_CARE_PROVIDER_SITE_OTHER): Payer: Medicare Other

## 2021-11-05 DIAGNOSIS — J309 Allergic rhinitis, unspecified: Secondary | ICD-10-CM

## 2021-11-26 ENCOUNTER — Ambulatory Visit (INDEPENDENT_AMBULATORY_CARE_PROVIDER_SITE_OTHER): Payer: Medicare Other

## 2021-11-26 DIAGNOSIS — J309 Allergic rhinitis, unspecified: Secondary | ICD-10-CM | POA: Diagnosis not present

## 2021-12-10 ENCOUNTER — Other Ambulatory Visit (HOSPITAL_COMMUNITY): Payer: Self-pay | Admitting: *Deleted

## 2021-12-11 ENCOUNTER — Ambulatory Visit (HOSPITAL_COMMUNITY)
Admission: RE | Admit: 2021-12-11 | Discharge: 2021-12-11 | Disposition: A | Discharge status: Home / Self Care | Payer: Medicare Other | Source: Ambulatory Visit | Attending: Family Medicine | Admitting: Family Medicine

## 2021-12-11 ENCOUNTER — Encounter: Payer: Self-pay | Admitting: Allergy

## 2021-12-11 LAB — POCT HEMOGLOBIN-HEMACUE: Hemoglobin: 14.9 g/dL (ref 12.0–15.0)

## 2021-12-11 NOTE — Progress Notes (Signed)
Hemocue 14.9. 561m [facility] VSS. Patient tolerated well.

## 2022-01-04 ENCOUNTER — Other Ambulatory Visit: Payer: Self-pay

## 2022-01-04 MED ORDER — GABAPENTIN 300 MG PO CAPS: ORAL_CAPSULE | ORAL | 3 refills | Status: DC

## 2022-01-11 ENCOUNTER — Telehealth: Payer: Self-pay | Admitting: *Deleted

## 2022-01-11 ENCOUNTER — Ambulatory Visit: Payer: Self-pay | Admitting: *Deleted

## 2022-01-11 DIAGNOSIS — J3081 Allergic rhinitis due to animal (cat) (dog) hair and dander: Secondary | ICD-10-CM

## 2022-01-11 NOTE — Telephone Encounter (Signed)
It was not corrected in patients chart that she would be continuing allergy injections so her next set of allergy vials were never made. Can we please put a rush on these vials and get them made please? She was sick so she has not been in a while. Her last injection was 11/26/21. Please advise dosage as well for next injection.

## 2022-01-11 NOTE — Progress Notes (Signed)
VIALS EXP 01-12-23

## 2022-01-12 DIAGNOSIS — J3089 Other allergic rhinitis: Secondary | ICD-10-CM

## 2022-01-12 NOTE — Telephone Encounter (Signed)
Patient's allergy flow sheet has been updated to reflect these changes. Called patient and informed that new vials are in and plan for starting her new vials. Patient verbalized understanding.

## 2022-01-14 ENCOUNTER — Ambulatory Visit (INDEPENDENT_AMBULATORY_CARE_PROVIDER_SITE_OTHER): Payer: Medicare Other | Admitting: *Deleted

## 2022-01-14 DIAGNOSIS — J309 Allergic rhinitis, unspecified: Secondary | ICD-10-CM

## 2022-01-21 ENCOUNTER — Ambulatory Visit (INDEPENDENT_AMBULATORY_CARE_PROVIDER_SITE_OTHER): Payer: Medicare Other | Admitting: *Deleted

## 2022-01-21 DIAGNOSIS — J309 Allergic rhinitis, unspecified: Secondary | ICD-10-CM | POA: Diagnosis not present

## 2022-01-22 ENCOUNTER — Other Ambulatory Visit: Payer: Self-pay | Admitting: Allergy

## 2022-01-28 ENCOUNTER — Ambulatory Visit (INDEPENDENT_AMBULATORY_CARE_PROVIDER_SITE_OTHER): Payer: Medicare Other

## 2022-01-28 DIAGNOSIS — J309 Allergic rhinitis, unspecified: Secondary | ICD-10-CM | POA: Diagnosis not present

## 2022-02-03 ENCOUNTER — Ambulatory Visit (INDEPENDENT_AMBULATORY_CARE_PROVIDER_SITE_OTHER): Payer: Medicare Other | Admitting: *Deleted

## 2022-02-03 DIAGNOSIS — J309 Allergic rhinitis, unspecified: Secondary | ICD-10-CM | POA: Diagnosis not present

## 2022-02-10 ENCOUNTER — Encounter (HOSPITAL_COMMUNITY): Payer: Medicare Other

## 2022-02-11 ENCOUNTER — Ambulatory Visit (INDEPENDENT_AMBULATORY_CARE_PROVIDER_SITE_OTHER): Payer: Medicare Other

## 2022-02-11 DIAGNOSIS — J309 Allergic rhinitis, unspecified: Secondary | ICD-10-CM

## 2022-02-15 ENCOUNTER — Ambulatory Visit (INDEPENDENT_AMBULATORY_CARE_PROVIDER_SITE_OTHER): Payer: Medicare Other | Admitting: *Deleted

## 2022-02-15 DIAGNOSIS — J309 Allergic rhinitis, unspecified: Secondary | ICD-10-CM | POA: Diagnosis not present

## 2022-03-17 ENCOUNTER — Ambulatory Visit (INDEPENDENT_AMBULATORY_CARE_PROVIDER_SITE_OTHER): Payer: Medicare Other

## 2022-03-17 DIAGNOSIS — J309 Allergic rhinitis, unspecified: Secondary | ICD-10-CM

## 2022-04-16 ENCOUNTER — Ambulatory Visit (INDEPENDENT_AMBULATORY_CARE_PROVIDER_SITE_OTHER): Payer: Medicare Other

## 2022-04-16 DIAGNOSIS — J309 Allergic rhinitis, unspecified: Secondary | ICD-10-CM

## 2022-04-22 NOTE — Progress Notes (Signed)
Chief Complaint  Patient presents with   Follow-up    Patient in room 1, here for follow up diabetic peripheral neuropathy ([place]). Was using heating nightly pad but stopped. Reports burning in feet, but take pain medication to help, noticed improvement.     HISTORY OF PRESENT ILLNESS:  04/26/22 ALL:  Caleesi returns for follow up for neuropathy. She continues gabapentin '300mg'$  in am and '900mg'$  at bedtime as well as alpha lipoic acid '600mg'$  daily. She reports doing well. Burning pain is stable, maybe slightly improved. She rarely has cramping of legs. She is no longer using Amber bracelets but does continue using heat at night. She takes 1/2 tab of Norco for breakthrough pain. Not using regularly.  Last A1C 6.6. She has follow up in March.   04/23/2021 ALL: AILEANA HODDER is a 72 y.o. female here today for follow up for neuropathy. Dr Jannifer Franklin increased her gabapentin to '300mg'$  in am and '900mg'$  at bedtime at consult in 09/2020. Symptoms are improved. She is also taking alpha lipoic acid. She continues to have waxing and waning burning. Heat and Building services engineer work well for cramps.   HISTORY (copied from Dr Jannifer Franklin' previous note)  Ms. Dimiceli is a 72 year old right-handed white female with a history of diabetes, her most recent hemoglobin A1c was 6.7.  She has a history of hemochromatosis as well.  She has had prior lumbosacral spine surgery done in May 2016, after surgery she has noted burning sensations in the distal portions of her feet bilaterally that has been persistent over time.  She was started on gabapentin taking 300 mg in the morning and 600 mg in the evening and 2019 after a nerve conduction study done was unremarkable.  This was done through the [facility]. The patient feels at this point that the gabapentin is not adequate to prevent discomfort at nighttime.  The patient has some discomfort during the day but this is not as bothersome to her.  She mainly has issues at night with  sleeping.  In the past, she has had significant problems with cramps involving the right foot and leg, but this has not been an issue over the last 6 months.  The patient denies any weakness of the extremities or any problems with falls or severe balance issues although at times she feels that her balance is slightly off.  She denies issues controlling the bowels or the bladder, she does have some constipation issues at times.  She has some achy pain in the hands, but no numbness.  At times, she may take a half of a Vicodin tablet at night to help her rest.  She has a history of restless leg syndrome in the past, but not currently.  She is sent to this office for further evaluation.   REVIEW OF SYSTEMS: Out of a complete 14 system review of symptoms, the patient complains only of the following symptoms, numbness and tingling of feet, leg cramps and all other reviewed systems are negative.   ALLERGIES: Allergies  Allergen Reactions   Actifed Cold-Allergy [Chlorpheniramine-Phenyleph Er] Other (See Comments)    tachycardia   Ampicillin Other (See Comments)    Causes yeast   Beef-Derived Products Other (See Comments)    Mood swings (no longer a problem ).     Benadryl [Diphenhydramine] Other (See Comments)    Restless legs   Darvocet [Propoxyphene N-Acetaminophen] Other (See Comments)    Floating sensation   Darvon Compound Other (See Comments)  Floating sensation   Ibuprofen     Fluid retention   Indomethacin Other (See Comments)    Diarrhea, burning sensation lower abd   Mobic [Meloxicam]     Significant fluid retention   Tofranil-Pm Other (See Comments)    Drug educed hepatitis   Ultram [Tramadol Hcl] Other (See Comments)    Severe syncope     HOME MEDICATIONS: Outpatient Medications Prior to Visit  Medication Sig Dispense Refill   Alpha-Lipoic Acid 300 MG CAPS Take 600 mg by mouth daily.     amphetamine-dextroamphetamine (ADDERALL) 5 MG tablet Take 5 mg by mouth as needed.       b complex vitamins capsule Take 1 capsule by mouth daily.     Calcium Carbonate Antacid (TUMS E-X 750 PO) Take 4 capsules by mouth daily.     Cholecalciferol (VITAMIN D) 125 MCG (5000 UT) CAPS      cyclobenzaprine (FLEXERIL) 10 MG tablet Take 10 mg by mouth at bedtime as needed (fibromyalgia).      EPINEPHrine 0.3 mg/0.3 mL IJ SOAJ injection Use for life threatening allergic reactions 1 each 1   HYDROcodone-acetaminophen (NORCO/VICODIN) 5-325 MG per tablet Take 1-2 tablets by mouth every 4 (four) hours as needed (mild pain). 60 tablet 0   ipratropium (ATROVENT) 0.06 % nasal spray USE 2 SPRAYS IN EACH NOSTRIL 3 TO 4 TIMES A DAY AS NEEDED FOR NASAL DRAINAGE. 15 mL 5   loratadine (CLARITIN) 10 MG tablet Take 10 mg by mouth daily as needed for allergies.     LORazepam (ATIVAN) 1 MG tablet Take 1 mg by mouth at bedtime as needed for sleep.      losartan (COZAAR) 50 MG tablet Take 50 mg by mouth daily.     Magnesium 500 MG TABS Take 500 mg by mouth every other day.     melatonin 5 MG TABS Take 5 mg by mouth at bedtime.     metFORMIN (GLUCOPHAGE-XR) 500 MG 24 hr tablet SMARTSIG:2 Tablet(s) By Mouth Every Evening     Multiple Vitamins-Minerals (LUTEIN-ZEAXANTHIN PO)      vitamin E 180 MG (400 UNITS) capsule      Zinc 30 MG CAPS Take 1 capsule by mouth daily at 6 (six) AM.     gabapentin (NEURONTIN) 300 MG capsule Takes '300mg'$  po am and 900 mg po pm 360 capsule 3   No facility-administered medications prior to visit.     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 heart disease    pt denies   Complication of anesthesia    Depression    history of  depression while caring for ill mother   Diabetic peripheral neuropathy ([place]) 10/17/2020   Eczema    Fatty liver    Fibromyalgia    Gallstones    GERD (gastroesophageal reflux disease)     H/O colonoscopy with polypectomy    H/O seasonal allergies    Hemochromatosis    compound heterozygous   Hepatitis    drug induced elevated liver enzymes   History of recurrent UTI (urinary tract infection)    due to urethral stricture;; s/p dilatation 25 years ago   HTN (hypertension)    Insomnia    Obesity    Ovarian cyst    PONV (postoperative nausea and vomiting)  Sciatica      PAST SURGICAL HISTORY: Past Surgical History:  Procedure Laterality Date   [place]   COLONOSCOPY  02/03/11   diminutive adenoma and hemorrhoids   EXPLORATORY LAPAROTOMY   07/15/2010     Lysis of adhesions, Bilateral salpingo-oophorectomy.    LAPAROSCOPIC CHOLECYSTECTOMY  03/17/00   with intraoperative cholangiogram.   LAPAROSCOPIC OOPHORECTOMY     LAPAROSCOPY  1982, 1990   LUMBAR LAMINECTOMY/DECOMPRESSION MICRODISCECTOMY Right 08/15/2014   Procedure: LUMBAR LAMINECTOMY/DECOMPRESSION MICRODISCECTOMY RIGHT LUMBAR FOUR-FIVE AND ,EXTRAFORAMINAL EXPLORATION;  Surgeon: Jovita Gamma, MD;  Location: [facility];  Service: Neurosurgery;  Laterality: Right;   TONSILLECTOMY AND ADENOIDECTOMY  1965   UPPER GASTROINTESTINAL ENDOSCOPY  02/03/11   54 Fr Maloney dilation (looked normal but dysphagia)   WISDOM TOOTH EXTRACTION  1972     FAMILY HISTORY: Family History  Problem Relation Age of Onset   Diabetes Mother        sister   Hypertension Mother        sister   Lung disease Mother    Osteoporosis Mother        brother   Basal cell carcinoma Mother    Mental illness Mother    Kidney failure Brother    Sleep apnea Sister        mother   Colon cancer Neg Hx      SOCIAL HISTORY: Social History   Socioeconomic History   Marital status: Married    Spouse name: Not on file   Number of children: 1   Years of education: Not on file   Highest education level: Not on file  Occupational History   Occupation: retired  Tobacco Use   Smoking status:  Former    Packs/day: 0.20    Years: 8.00    Total pack years: 1.60    Types: Cigarettes    Quit date: 03/29/2010    Years since quitting: 12.0   Smokeless tobacco: Never  Vaping Use   Vaping Use: Never used  Substance and Sexual Activity   Alcohol use: Yes    Comment: ocassionaly   Drug use: No   Sexual activity: Not Currently  Other Topics Concern   Not on file  Social History Narrative   ** Merged History Encounter **       1-2 caffeine drinks a day   Social Determinants of Radio broadcast assistant Strain: Not on file  Food Insecurity: Not on file  Transportation Needs: Not on file  Physical Activity: Not on file  Stress: Not on file  Social Connections: Not on file  Intimate Partner Violence: Not on file     PHYSICAL EXAM  Vitals:   04/26/22 1042  BP: (!) 162/88  Pulse: 90  Weight: 185 lb 8 oz (84.1 kg)  Height: '5\' 4"'$  (1.626 m)    Body mass index is 31.84 kg/m.  Generalized: Well developed, in no acute distress  Cardiology: normal rate and rhythm, no murmur auscultated  Respiratory: clear to auscultation bilaterally    Neurological examination  Mentation: Alert oriented to time, place, history taking. Follows all commands speech and language fluent Cranial nerve II-XII: Pupils were equal round reactive to light. Extraocular movements were full, visual field were full on confrontational test. Facial sensation and strength were normal. Head turning and shoulder shrug  were normal and symmetric. Motor: The motor testing reveals 5 over 5 strength of all 4 extremities. Good symmetric motor tone is noted  throughout.  Gait and station: Gait is normal.    DIAGNOSTIC DATA (LABS, IMAGING, TESTING) - I reviewed patient records, labs, notes, testing and imaging myself where available.  Lab Results  Component Value Date   WBC 6.7 08/13/2014   HGB 15.6 (H) 04/23/2022   HCT 40.7 08/13/2014   MCV 91.7 08/13/2014   PLT 183 08/13/2014      Component Value  Date/Time   NA 143 08/13/2014 1507   K 4.2 08/13/2014 1507   CL 108 08/13/2014 1507   CO2 27 08/13/2014 1507   GLUCOSE 122 (H) 08/13/2014 1507   BUN 13 08/13/2014 1507   CREATININE 0.65 08/13/2014 1507   CALCIUM 9.8 08/13/2014 1507   PROT 7.2 10/17/2020 1218   ALBUMIN 3.8 07/08/2010 1225   AST 42 (H) 07/08/2010 1225   ALT 35 07/08/2010 1225   ALKPHOS 45 07/08/2010 1225   BILITOT 0.4 07/08/2010 1225   GFRNONAA >60 08/13/2014 1507   GFRAA >60 08/13/2014 1507   No results found for: "CHOL", "HDL", "LDLCALC", "LDLDIRECT", "TRIG", "CHOLHDL" No results found for: "HGBA1C" Lab Results  Component Value Date   VITAMINB12 1,145 10/17/2020   No results found for: "TSH"      No data to display               No data to display           ASSESSMENT AND PLAN  72 y.o. year old female  has a past medical history of Allergic rhinitis, Anemia, Anxiety, Cardiac arrhythmia due to congenital heart disease, Complication of anesthesia, Depression, Diabetic peripheral neuropathy ([place]) (10/17/2020), Eczema, Fatty liver, Fibromyalgia, Gallstones, GERD (gastroesophageal reflux disease), H/O colonoscopy with polypectomy, H/O seasonal allergies, Hemochromatosis, Hepatitis, History of recurrent UTI (urinary tract infection), HTN (hypertension), Insomnia, Obesity, Ovarian cyst, PONV (postoperative nausea and vomiting), and Sciatica. here with    Diabetic peripheral neuropathy (H[facility])  Emaley is doing well, today. We will continue gabapentin '300mg'$  in the morning and '900mg'$  at bedtime. She will continue alpha lipoic acid and complementary therapies if beneficial. She may reduce dose of gabapentin slowly as discussed. Healthy lifestyle habits encouraged. She will follow up with me in 1 year, sooner if needed.    No orders of the defined types were placed in this encounter.    Meds ordered this encounter  Medications   gabapentin (NEURONTIN) 300 MG capsule    Sig: Takes '300mg'$  po am and 900 mg po pm     Dispense:  360 capsule    Refill:  3    Order Specific Question:   Supervising Provider    Answer:   Melvenia Beam [[id number]]      Debbora Presto, MSN, FNP-C 04/26/2022, 11:15 AM  [facility] [address] [place],  [address] [phone]

## 2022-04-22 NOTE — Patient Instructions (Signed)
Below is our plan:  We will continue gabapentin '300mg'$  in am and 900 at bedtime. May reduce dose slowly as needed. Let me know if you need me.   Please make sure you are staying well hydrated. I recommend 50-60 ounces daily. Well balanced diet and regular exercise encouraged. Consistent sleep schedule with 6-8 hours recommended.   Please continue follow up with care team as directed.   Follow up with me in 1 year   You may receive a survey regarding today's visit. I encourage you to leave honest feed back as I do use this information to improve patient care. Thank you for seeing me today!

## 2022-04-23 ENCOUNTER — Ambulatory Visit (HOSPITAL_COMMUNITY)
Admission: RE | Admit: 2022-04-23 | Discharge: 2022-04-23 | Disposition: A | Discharge status: Home / Self Care | Payer: Medicare Other | Source: Ambulatory Visit | Attending: Family Medicine | Admitting: Family Medicine

## 2022-04-23 LAB — POCT HEMOGLOBIN-HEMACUE: Hemoglobin: 15.6 g/dL — ABNORMAL HIGH (ref 12.0–15.0)

## 2022-04-23 NOTE — Progress Notes (Signed)
Here today for therapeutic phlebotomy. Hemocue 15.9. 500 ml blood removed per MD order from right AC. Pt tolerated well. VSS.

## 2022-04-26 ENCOUNTER — Ambulatory Visit: Payer: Medicare Other | Admitting: Family Medicine

## 2022-04-26 ENCOUNTER — Encounter: Payer: Self-pay | Admitting: Family Medicine

## 2022-04-26 VITALS — BP 162/88 | HR 90 | Ht 64.0 in | Wt 185.5 lb

## 2022-04-26 DIAGNOSIS — E1142 Type 2 diabetes mellitus with diabetic polyneuropathy: Secondary | ICD-10-CM

## 2022-04-26 MED ORDER — GABAPENTIN 300 MG PO CAPS: ORAL_CAPSULE | ORAL | 3 refills | Status: DC

## 2022-05-05 ENCOUNTER — Encounter: Payer: Self-pay | Admitting: Internal Medicine

## 2022-06-16 ENCOUNTER — Encounter: Payer: Self-pay | Admitting: Internal Medicine

## 2022-06-16 ENCOUNTER — Ambulatory Visit: Payer: Medicare Other | Admitting: Internal Medicine

## 2022-06-16 VITALS — BP 120/68 | HR 88 | Ht 64.0 in | Wt 187.0 lb

## 2022-06-16 DIAGNOSIS — R1319 Other dysphagia: Secondary | ICD-10-CM

## 2022-06-16 DIAGNOSIS — R110 Nausea: Secondary | ICD-10-CM | POA: Diagnosis not present

## 2022-06-16 NOTE — Progress Notes (Signed)
Miranda Stark 72 y.o. Jul 29, 1950 [id number]  Assessment & Plan:   Encounter Diagnoses  Name Primary?   Esophageal dysphagia Yes   Nausea without vomiting    Evaluate and treat dysphagia with EGD and likely Maloney dilation.  This will serve to evaluate this episodic nausea as well.  This seems to be some sort of postviral syndrome, I told her it is unlikely I will find an abnormality on the EGD that explains that but we will see what is learned.  The risks and benefits as well as alternatives of endoscopic procedure(s) have been discussed and reviewed. All questions answered. The patient agrees to proceed.  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  She was actually seen and evaluated for thrush by primary care but did not seem to have that.  The patient had self treated with nystatin prior to that.  She was in [place] at the [place] in September and felt ill on the plane ride home in October without fever Tmax 101 fatigue, and anorexia.  No other symptoms no nausea vomiting or diarrhea.  3 COVID tests were negative around that time.  Since that time she has experienced intermittent nausea and urge to vomit at times.  Initially it was daily and she had to limit her diet quite a bit after 2 weeks that resolved and now she gets it episodically most recently 2 days ago when she smelled food that her husband had cooked upon entering the house.  She has not vomited.  She is not having classic heartburn but had this fuzzy tongue issue and was empirically started on Pepcid 20 mg twice daily by Dr. Theda Sers her primary care  provider.  She continues to undergo phlebotomy about every 4 months for her compound heterozygote hemochromatosis.  She has known NAFLD.  Up-to-date with colonoscopy testing next due 2018.  Wt Readings from Last 3 Encounters:  06/16/22 187 lb (84.8 kg)  04/26/22 185 lb 8 oz (84.1 kg)  04/23/22 180 lb (81.6 kg)    Allergies  Allergen Reactions   Actifed Cold-Allergy [Chlorpheniramine-Phenyleph Er] Other (See Comments)    tachycardia   Ampicillin Other (See Comments)    Causes yeast   Beef-Derived Products Other (See Comments)    Mood swings (no longer a problem ).     Benadryl [Diphenhydramine] Other (See Comments)    Restless legs   Darvocet [Propoxyphene N-Acetaminophen] Other (See Comments)    Floating sensation   Darvon Compound Other (See Comments)    Floating sensation   Ibuprofen     Fluid retention   Indomethacin Other (See Comments)    Diarrhea, burning sensation lower abd   Mobic [Meloxicam]     Significant fluid retention   Tofranil-Pm Other (See Comments)    Drug educed hepatitis   Ultram [Tramadol Hcl] Other (See Comments)    Severe syncope   Current Meds  Medication Sig   Alpha-Lipoic Acid 300 MG CAPS Take 600 mg by mouth daily.   amphetamine-dextroamphetamine (ADDERALL) 5 MG tablet Take 5 mg by mouth as needed.    b complex vitamins capsule Take 1 capsule by mouth  daily.   Calcium Carbonate Antacid (TUMS E-X 750 PO) Take 4 capsules by mouth daily.   Cholecalciferol (VITAMIN D) 125 MCG (5000 UT) CAPS    cyclobenzaprine (FLEXERIL) 10 MG tablet Take 10 mg by mouth at bedtime as needed (fibromyalgia).    famotidine (PEPCID) 20 MG tablet Take 20 mg by mouth 2 (two) times daily.   gabapentin (NEURONTIN) 300 MG capsule Takes 300mg  po am and 900 mg po pm (Patient taking differently: Takes 300mg  po am and 600mg  po pm)   HYDROcodone-acetaminophen (NORCO/VICODIN) 5-325 MG per tablet Take 1-2 tablets by mouth every 4 (four) hours as needed (mild pain).   ipratropium  (ATROVENT) 0.06 % nasal spray USE 2 SPRAYS IN EACH NOSTRIL 3 TO 4 TIMES A DAY AS NEEDED FOR NASAL DRAINAGE.   loratadine (CLARITIN) 10 MG tablet Take 10 mg by mouth daily as needed for allergies.   LORazepam (ATIVAN) 1 MG tablet Take 1 mg by mouth at bedtime as needed for sleep.    losartan (COZAAR) 50 MG tablet Take 50 mg by mouth daily.   Magnesium 500 MG TABS Take 500 mg by mouth every other day.   melatonin 5 MG TABS Take 5 mg by mouth at bedtime.   metFORMIN (GLUCOPHAGE-XR) 500 MG 24 hr tablet SMARTSIG:2 Tablet(s) By Mouth Every Evening   Multiple Vitamins-Minerals (LUTEIN-ZEAXANTHIN PO)    vitamin E 180 MG (400 UNITS) capsule    Zinc 30 MG CAPS Take 1 capsule by mouth daily at 6 (six) AM.   Past Medical History:  Diagnosis Date   Allergic rhinitis    Anemia    prior to  hysterectomy   Anxiety    takes lorazepam for  prn anxiety   Cardiac arrhythmia due to congenital heart disease    pt denies   Colon polyp    Complication of anesthesia    Depression    history of  depression while caring for ill mother   Depression    Diabetic peripheral neuropathy ([place]) 10/17/2020   Eczema    Fatty liver    Fibromyalgia    Gallstones    GERD (gastroesophageal reflux disease)    H/O colonoscopy with polypectomy    H/O seasonal allergies    Hemochromatosis    compound heterozygous   Hepatitis    drug induced elevated liver enzymes   High blood pressure    History of recurrent UTI (urinary tract infection)    due to urethral stricture;; s/p dilatation 25 years ago   HTN (hypertension)    Insomnia    Obesity    Ovarian cyst    PONV (postoperative nausea and vomiting)    Sciatica    Past Surgical History:  Procedure Laterality Date   [place]   COLONOSCOPY  02/03/11   diminutive adenoma and hemorrhoids   EXPLORATORY LAPAROTOMY   07/15/2010     Lysis of adhesions, Bilateral salpingo-oophorectomy.    LAPAROSCOPIC CHOLECYSTECTOMY  03/17/00    with intraoperative cholangiogram.   LAPAROSCOPIC OOPHORECTOMY     LAPAROSCOPY  1982, 1990   LUMBAR LAMINECTOMY/DECOMPRESSION MICRODISCECTOMY Right 08/15/2014   Procedure: LUMBAR LAMINECTOMY/DECOMPRESSION MICRODISCECTOMY RIGHT LUMBAR FOUR-FIVE AND ,EXTRAFORAMINAL EXPLORATION;  Surgeon: Jovita Gamma, MD;  Location: [facility];  Service: Neurosurgery;  Laterality: Right;   [place]   UPPER GASTROINTESTINAL ENDOSCOPY  02/03/11   54 Fr Maloney dilation (looked normal but dysphagia)   [place]   Social History  Social History Narrative   Married to Medical illustrator) [employer]   1 son   1-2 alcoholic beverages a month, coffee in the morning former smoker no tobacco now and no drug use   family history includes Basal cell carcinoma in her mother; Breast cancer in her sister; Diabetes in her mother and sister; Heart disease in her maternal grandmother; Hypertension in her mother; Kidney failure in her brother; Lung disease in her mother; Mental illness in her mother; Osteoporosis in her mother; Sleep apnea in her sister.   Review of Systems See HPI  Some seasonal allergies back pain, myalgia and skin itching at times.  All other review of systems negative.  Objective:   Physical Exam @BP  120/68   Pulse 88   Ht 5\' 4"  (1.626 m)   Wt 187 lb (84.8 kg)   SpO2 95%   BMI 32.10 kg/m @  General:  Well-developed, well-nourished and in no acute distress Eyes:  anicteric. ENT:   Mouth and posterior pharynx free of lesions. + crowns  Lungs: Clear to auscultation bilaterally. Heart:   S1S2, no rubs, murmurs, gallops. Abdomen: obese soft, non-tender, no hepatosplenomegaly, hernia, or mass and BS+.  Neuro:  A&O x 3.  Psych:  appropriate mood and  Affect.   Data Reviewed:  See HPI

## 2022-06-16 NOTE — Patient Instructions (Signed)
You have been scheduled for an endoscopy. Please follow written instructions given to you at your visit today. If you use inhalers (even only as needed), please bring them with you on the day of your procedure.  _______________________________________________________  If your blood pressure at your visit was 140/90 or greater, please contact your primary care physician to follow up on this.  _______________________________________________________  If you are age 6 or older, your body mass index should be between 23-30. Your Body mass index is 32.1 kg/m. If this is out of the aforementioned range listed, please consider follow up with your Primary Care Provider.  If you are age 18 or younger, your body mass index should be between 19-25. Your Body mass index is 32.1 kg/m. If this is out of the aformentioned range listed, please consider follow up with your Primary Care Provider.   ________________________________________________________  The [facility] providers would like to encourage you to use [facility] to communicate with providers for non-urgent requests or questions.  Due to long hold times on the telephone, sending your provider a message by [facility] may be a faster and more efficient way to get a response.  Please allow 48 business hours for a response.  Please remember that this is for non-urgent requests.  _______________________________________________________   I appreciate the opportunity to care for you. Silvano Rusk, MD, [facility]

## 2022-06-30 ENCOUNTER — Encounter: Payer: Self-pay | Admitting: Certified Registered Nurse Anesthetist

## 2022-06-30 ENCOUNTER — Encounter: Payer: Medicare Other | Admitting: Internal Medicine

## 2022-07-01 ENCOUNTER — Encounter: Payer: Self-pay | Admitting: Internal Medicine

## 2022-07-01 ENCOUNTER — Ambulatory Visit (AMBULATORY_SURGERY_CENTER): Payer: Medicare Other | Admitting: Internal Medicine

## 2022-07-01 VITALS — BP 118/67 | HR 85 | Temp 98.0°F | Resp 20 | Ht 64.0 in | Wt 187.0 lb

## 2022-07-01 DIAGNOSIS — R1319 Other dysphagia: Secondary | ICD-10-CM | POA: Diagnosis not present

## 2022-07-01 DIAGNOSIS — K2970 Gastritis, unspecified, without bleeding: Secondary | ICD-10-CM

## 2022-07-01 DIAGNOSIS — K2950 Unspecified chronic gastritis without bleeding: Secondary | ICD-10-CM | POA: Diagnosis not present

## 2022-07-01 DIAGNOSIS — R110 Nausea: Secondary | ICD-10-CM

## 2022-07-01 MED ORDER — SODIUM CHLORIDE 0.9 % IV SOLN: 500.0000 mL | Freq: Once | INTRAVENOUS | Status: DC

## 2022-07-01 NOTE — Progress Notes (Signed)
History and Physical Interval Note:  07/01/2022 9:51 AM  Miranda Stark  has presented today for endoscopic procedure(s), with the diagnosis of  Encounter Diagnoses  Name Primary?   Esophageal dysphagia Yes   Nausea without vomiting   .  The various methods of evaluation and treatment have been discussed with the patient and/or family. After consideration of risks, benefits and other options for treatment, the patient has consented to  the endoscopic procedure(s).   The patient's history has been reviewed, patient examined, no change in status, stable for endoscopic procedure(s).  I have reviewed the patient's chart and labs.  Questions were answered to the patient's satisfaction.     Gatha Mayer, MD, [facility]

## 2022-07-01 NOTE — Progress Notes (Signed)
Report given to PACU, vss 

## 2022-07-01 NOTE — Patient Instructions (Addendum)
I dilated the esophagus again - did not see a stricture. Tiny hiatal hernia as before. I did take some stomach biopsies - there was suggestion of gastritis.  I will let you know.  I appreciate the opportunity to care for you. Gatha Mayer, MD, FACG YOU HAD AN ENDOSCOPIC PROCEDURE TODAY AT [facility]:   Refer to the procedure report that was given to you for any specific questions about what was found during the examination.  If the procedure report does not answer your questions, please call your gastroenterologist to clarify.  If you requested that your care partner not be given the details of your procedure findings, then the procedure report has been included in a sealed envelope for you to review at your convenience later.  YOU SHOULD EXPECT: Some feelings of bloating in the abdomen. Passage of more gas than usual.  Walking can help get rid of the air that was put into your GI tract during the procedure and reduce the bloating. If you had a lower endoscopy (such as a colonoscopy or flexible sigmoidoscopy) you may notice spotting of blood in your stool or on the toilet paper. If you underwent a bowel prep for your procedure, you may not have a normal bowel movement for a few days.  Please Note:  You might notice some irritation and congestion in your nose or some drainage.  This is from the oxygen used during your procedure.  There is no need for concern and it should clear up in a day or so.  SYMPTOMS TO REPORT IMMEDIATELY:    Following upper endoscopy (EGD)  Vomiting of blood or coffee ground material  New chest pain or pain under the shoulder blades  Painful or persistently difficult swallowing  New shortness of breath  Fever of 100F or higher  Black, tarry-looking stools  For urgent or emergent issues, a gastroenterologist can be reached at any hour by calling [phone]. Do not use MyChart messaging for urgent concerns.    DIET:  We do recommend a small  meal at first, but then you may proceed to your regular diet.  Drink plenty of fluids but you should avoid alcoholic beverages for 24 hours.  ACTIVITY:  You should plan to take it easy for the rest of today and you should NOT DRIVE or use heavy machinery until tomorrow (because of the sedation medicines used during the test).    FOLLOW UP: Our staff will call the number listed on your records the next business day following your procedure.  We will call around 7:15- 8:00 am to check on you and address any questions or concerns that you may have regarding the information given to you following your procedure. If we do not reach you, we will leave a message.     If any biopsies were taken you will be contacted by phone or by letter within the next 1-3 weeks.  Please call us at [phone] if you have not heard about the biopsies in 3 weeks.    SIGNATURES/CONFIDENTIALITY: You and/or your care partner have signed paperwork which will be entered into your electronic medical record.  These signatures attest to the fact that that the information above on your After Visit Summary has been reviewed and is understood.  Full responsibility of the confidentiality of this discharge information lies with you and/or your care-partner.

## 2022-07-01 NOTE — Progress Notes (Signed)
Pt's states no medical or surgical changes since previsit or office visit. 

## 2022-07-01 NOTE — Progress Notes (Signed)
Called to room to assist during endoscopic procedure.  Patient ID and intended procedure confirmed with present staff. Received instructions for my participation in the procedure from the performing physician.  

## 2022-07-01 NOTE — Op Note (Signed)
[facility] Patient Name: Miranda Stark Procedure Date: 07/01/2022 9:35 AM MRN: [id number] Endoscopist: Gatha Mayer , MD, [id number] Age: 72 Referring MD:  Date of Birth: 08/30/1950 Gender: Female Account #: [id number] Procedure:                Upper GI endoscopy Indications:              Dysphagia, Nausea Medicines:                Monitored Anesthesia Care Procedure:                Pre-Anesthesia Assessment:                           - Prior to the procedure, a History and Physical                            was performed, and patient medications and                            allergies were reviewed. The patient's tolerance of                            previous anesthesia was also reviewed. The risks                            and benefits of the procedure and the sedation                            options and risks were discussed with the patient.                            All questions were answered, and informed consent                            was obtained. Prior Anticoagulants: The patient has                            taken no anticoagulant or antiplatelet agents. ASA                            Grade Assessment: II - A patient with mild systemic                            disease. After reviewing the risks and benefits,                            the patient was deemed in satisfactory condition to                            undergo the procedure.                           After obtaining informed consent, the endoscope was  passed under direct vision. Throughout the                            procedure, the patient's blood pressure, pulse, and                            oxygen saturations were monitored continuously. The                            Olympus Scope [id number] was introduced through the                            mouth, and advanced to the second part of duodenum.                            The upper GI endoscopy was  accomplished without                            difficulty. The patient tolerated the procedure                            well. Scope In: Scope Out: Findings:                 No endoscopic abnormality was evident in the                            esophagus to explain the patient's complaint of                            dysphagia. It was decided, however, to proceed with                            dilation of the entire esophagus. The scope was                            withdrawn. Dilation was performed with a Maloney                            dilator with mild resistance at 36 Fr. The dilation                            site was examined following endoscope reinsertion                            and showed no change. Estimated blood loss: none.                           A 1 cm hiatal hernia was present.                           Diffuse mild inflammation characterized by  erythematous and mottled mucosa was found in the                            gastric antrum. Biopsies were taken with a cold                            forceps for histology. Verification of patient                            identification for the specimen was done. Estimated                            blood loss was minimal.                           The gastroesophageal flap valve was visualized                            endoscopically and classified as Hill Grade II                            (fold present, opens with respiration).                           The exam was otherwise without abnormality.                           The cardia and gastric fundus were normal on                            retroflexion. Complications:            No immediate complications. Estimated Blood Loss:     Estimated blood loss was minimal. Impression:               - No endoscopic esophageal abnormality to explain                            patient's dysphagia. Esophagus dilated. Dilated.                            - 1 cm hiatal hernia.                           - Gastritis. Biopsied.                           - Gastroesophageal flap valve classified as Hill                            Grade II (fold present, opens with respiration).                           - The examination was otherwise normal. Recommendation:           - Patient has a contact number available for  emergencies. The signs and symptoms of potential                            delayed complications were discussed with the                            patient. Return to normal activities tomorrow.                            Written discharge instructions were provided to the                            patient.                           - Clear liquids x 1 hour then soft foods rest of                            day. Start prior diet tomorrow.                           - Continue present medications.                           - Await pathology results. Consider using PPI as                            opposed to H2 Blocker Gatha Mayer, MD 07/01/2022 10:17:25 AM This report has been signed electronically.

## 2022-07-02 ENCOUNTER — Telehealth: Payer: Self-pay | Admitting: *Deleted

## 2022-07-02 NOTE — Telephone Encounter (Signed)
  Follow up Call-     07/01/2022    9:35 AM  Call back number  Post procedure Call Back phone  # [phone]  Permission to leave phone message Yes     Patient questions:  Do you have a fever, pain , or abdominal swelling? No. Pain Score  0 *  Have you tolerated food without any problems? Yes.    Have you been able to return to your normal activities? Yes.    Do you have any questions about your discharge instructions: Diet   No. Medications  No. Follow up visit  No.  Do you have questions or concerns about your Care? No.  Actions: * If pain score is 4 or above: No action needed, pain <4.

## 2022-07-12 ENCOUNTER — Other Ambulatory Visit: Payer: Self-pay | Admitting: Internal Medicine

## 2022-07-12 MED ORDER — OMEPRAZOLE 20 MG PO CPDR: 20.0000 mg | DELAYED_RELEASE_CAPSULE | Freq: Every day | ORAL | 3 refills | Status: DC

## 2022-08-19 ENCOUNTER — Other Ambulatory Visit (HOSPITAL_COMMUNITY): Payer: Self-pay

## 2022-08-20 ENCOUNTER — Ambulatory Visit (HOSPITAL_COMMUNITY)
Admission: RE | Admit: 2022-08-20 | Discharge: 2022-08-20 | Disposition: A | Discharge status: Home / Self Care | Payer: Medicare Other | Source: Ambulatory Visit | Attending: Family Medicine | Admitting: Family Medicine

## 2022-08-20 LAB — POCT HEMOGLOBIN-HEMACUE: Hemoglobin: 13 g/dL (ref 12.0–15.0)

## 2022-08-24 ENCOUNTER — Encounter (HOSPITAL_COMMUNITY): Payer: Medicare Other

## 2022-09-08 ENCOUNTER — Ambulatory Visit: Payer: Medicare Other | Admitting: Allergy

## 2022-09-08 ENCOUNTER — Encounter: Payer: Self-pay | Admitting: Allergy

## 2022-09-08 VITALS — BP 122/74 | HR 85 | Temp 98.1°F | Resp 18 | Ht 63.0 in | Wt 183.7 lb

## 2022-09-08 DIAGNOSIS — J3089 Other allergic rhinitis: Secondary | ICD-10-CM | POA: Diagnosis not present

## 2022-09-08 DIAGNOSIS — T781XXD Other adverse food reactions, not elsewhere classified, subsequent encounter: Secondary | ICD-10-CM

## 2022-09-08 NOTE — Patient Instructions (Addendum)
Allergic rhinitis--> improved with immunotherapy!  - Claritin as needed.   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  - continue pineapple in diet   Follow-up as needed.   It has been a pleasure being apart of your medical team!

## 2022-09-08 NOTE — Progress Notes (Signed)
Follow-up Note  RE: Miranda Stark MRN: [id number] DOB: July 19, 1950 Date of Office Visit: 09/08/2022   History of present illness: Miranda Stark is a 72 y.o. female presenting today for follow-up of allergic rhinitis with history of pollen food allergy syndrome.  She was last in the office on 09/04/2021 by myself.  In January she had her last immunotherapy injection.  Thus she has been off now for about 6 months and she has not noted any increase in allergy symptoms.  She states sometime in March she did take a dose of Claritin.  She recently went to the beach and stayed a hotel so she did take Claritin while she was there.  Other than that she has not needed to use Claritin for other purposes.  She has not needed to use any nasal sprays.  She has done quite well.  Her oral allergy syndrome is also resolved with immunotherapy as she does tolerate for the most part eating pineapple.  Review of systems: Review of Systems  Constitutional: Negative.   HENT: Negative.    Eyes: Negative.   Respiratory: Negative.    Cardiovascular: Negative.   Gastrointestinal: Negative.   Musculoskeletal: Negative.   Skin: Negative.   Allergic/Immunologic: Negative.   Neurological: Negative.      All other systems negative unless noted above in HPI  Past medical/social/surgical/family history have been reviewed and are unchanged unless specifically indicated below.  No changes  Medication List: Current Outpatient Medications  Medication Sig Dispense Refill   Alpha-Lipoic Acid 300 MG CAPS Take 600 mg by mouth daily.     amphetamine-dextroamphetamine (ADDERALL) 5 MG tablet Take 5 mg by mouth as needed.      b complex vitamins capsule Take 1 capsule by mouth daily.     budesonide (RHINOCORT AQUA) 32 MCG/ACT nasal spray Place 1 spray into both nostrils as needed.     Calcium Carbonate Antacid (TUMS E-X 750 PO) Take 4 capsules by mouth daily.     Cholecalciferol (VITAMIN D) 125 MCG (5000 UT) CAPS       cyclobenzaprine (FLEXERIL) 10 MG tablet Take 10 mg by mouth at bedtime as needed (fibromyalgia).      fluticasone (FLONASE) 50 MCG/ACT nasal spray Place 1 spray into both nostrils as needed.     gabapentin (NEURONTIN) 300 MG capsule Takes 300mg  po am and 900 mg po pm (Patient taking differently: Takes 300mg  po am and 600mg  po pm) 360 capsule 3   HYDROcodone-acetaminophen (NORCO/VICODIN) 5-325 MG per tablet Take 1-2 tablets by mouth every 4 (four) hours as needed (mild pain). 60 tablet 0   ipratropium (ATROVENT) 0.06 % nasal spray USE 2 SPRAYS IN EACH NOSTRIL 3 TO 4 TIMES A DAY AS NEEDED FOR NASAL DRAINAGE. 15 mL 5   loratadine (CLARITIN) 10 MG tablet Take 10 mg by mouth daily as needed for allergies.     LORazepam (ATIVAN) 1 MG tablet Take 1 mg by mouth at bedtime as needed for sleep.      losartan (COZAAR) 50 MG tablet Take 50 mg by mouth daily.     Magnesium 500 MG TABS Take 500 mg by mouth every other day.     melatonin 5 MG TABS Take 5 mg by mouth at bedtime.     metFORMIN (GLUCOPHAGE-XR) 500 MG 24 hr tablet SMARTSIG:2 Tablet(s) By Mouth Every Evening     omeprazole (PRILOSEC) 20 MG capsule Take 1 capsule (20 mg total) by mouth daily. 90 capsule 3  vitamin E 180 MG (400 UNITS) capsule      Zinc 30 MG CAPS Take 1 capsule by mouth daily at 6 (six) AM.     No current facility-administered medications for this visit.     Known medication allergies: Allergies  Allergen Reactions   Actifed Cold-Allergy [Chlorpheniramine-Phenyleph Er] Other (See Comments)    tachycardia   Ampicillin Other (See Comments)    Causes yeast   Beef-Derived Products Other (See Comments)    Mood swings (no longer a problem ).     Benadryl [Diphenhydramine] Other (See Comments)    Restless legs   Darvocet [Propoxyphene N-Acetaminophen] Other (See Comments)    Floating sensation   Darvon Compound Other (See Comments)    Floating sensation   Ibuprofen     Fluid retention   Indomethacin Other (See Comments)     Diarrhea, burning sensation lower abd   Mobic [Meloxicam]     Significant fluid retention   Penicillins Other (See Comments)    Yeast infection   Tofranil-Pm Other (See Comments)    Drug educed hepatitis   Ultram [Tramadol Hcl] Other (See Comments)    Severe syncope     Physical examination: Blood pressure 122/74, pulse 85, temperature 98.1 F (36.7 C), temperature source Temporal, resp. rate 18, height 5\' 3"  (1.6 m), weight 183 lb 11.2 oz (83.3 kg), SpO2 97 %.  General: Alert, interactive, in no acute distress. HEENT: PERRLA, TMs pearly gray, turbinates non-edematous without discharge, post-pharynx non erythematous. Neck: Supple without lymphadenopathy. Lungs: Clear to auscultation without wheezing, rhonchi or rales. {no increased work of breathing. CV: Normal S1, S2 without murmurs. Abdomen: Nondistended, nontender. Skin: Warm and dry, without lesions or rashes. Extremities:  No clubbing, cyanosis or edema. Neuro:   Grossly intact.  Diagnositics/Labs: None today   Assessment and plan:   Allergic rhinitis--> improved with immunotherapy!  - Claritin as needed.   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  - continue pineapple in diet   Follow-up as needed.  I appreciate the opportunity to take part in Miranda Stark's care. Please do not hesitate to contact me with questions.  Sincerely,   Margo Aye, MD Allergy/Immunology [facility]

## 2022-11-22 ENCOUNTER — Encounter (HOSPITAL_COMMUNITY): Payer: Medicare Other

## 2023-04-26 ENCOUNTER — Ambulatory Visit: Payer: Medicare Other | Admitting: Family Medicine

## 2023-06-27 ENCOUNTER — Other Ambulatory Visit: Payer: Self-pay | Admitting: Internal Medicine

## 2023-07-11 ENCOUNTER — Other Ambulatory Visit: Payer: Self-pay | Admitting: *Deleted

## 2023-07-11 MED ORDER — GABAPENTIN 300 MG PO CAPS: ORAL_CAPSULE | ORAL | 0 refills | Status: DC

## 2023-07-11 NOTE — Telephone Encounter (Signed)
 Last seen on 04/26/22 per note " We will continue gabapentin 300mg  in the morning and 900mg  at bedtime"  Follow up scheduled on 08/03/23

## 2023-08-01 NOTE — Patient Instructions (Incomplete)
 Below is our plan:  We will continue gabapentin  300mg  in am and 900mg  in the pm. If pain remains well managed, you can slowly reduce gabapentin  by 1 capsule weekly every 2-3 weeks.   Please make sure you are staying well hydrated. I recommend 50-60 ounces daily. Well balanced diet and regular exercise encouraged. Consistent sleep schedule with 6-8 hours recommended.   Please continue follow up with care team as directed.   Follow up with me in 1 year   You may receive a survey regarding today's visit. I encourage you to leave honest feed back as I do use this information to improve patient care. Thank you for seeing me today!

## 2023-08-01 NOTE — Progress Notes (Unsigned)
 No chief complaint on file.    HISTORY OF PRESENT ILLNESS:  08/01/23 ALL:  Miranda Stark returns for follow up for neuropathy. She continues gabapentin  300mg  in am and 900mg  at bedtime as well as alpha lipoic acid 600mg  daily.   04/26/2022 ALL:  Miranda Stark returns for follow up for neuropathy. She continues gabapentin  300mg  in am and 900mg  at bedtime as well as alpha lipoic acid 600mg  daily. She reports doing well. Burning pain is stable, maybe slightly improved. She rarely has cramping of legs. She is no longer using Amber bracelets but does continue using heat at night. She takes 1/2 tab of Norco for breakthrough pain. Not using regularly.  Last A1C 6.6. She has follow up in March.   04/23/2021 ALL: Miranda Stark is a 73 y.o. female here today for follow up for neuropathy. Dr Tilda Fogo increased her gabapentin  to 300mg  in am and 900mg  at bedtime at consult in 09/2020. Symptoms are improved. She is also taking alpha lipoic acid. She continues to have waxing and waning burning. Heat and Production manager work well for cramps.   HISTORY (copied from Dr Tilda Fogo' previous note)  Miranda Stark is a 73 year old right-handed white female with a history of diabetes, her most recent hemoglobin A1c was 6.7.  She has a history of hemochromatosis as well.  She has had prior lumbosacral spine surgery done in May 2016, after surgery she has noted burning sensations in the distal portions of her feet bilaterally that has been persistent over time.  She was started on gabapentin  taking 300 mg in the morning and 600 mg in the evening and 2019 after a nerve conduction study done was unremarkable.  This was done through [facility]. The patient feels at this point that the gabapentin  is not adequate to prevent discomfort at nighttime.  The patient has some discomfort during the day but this is not as bothersome to her.  She mainly has issues at night with sleeping.  In the past, she has had significant problems with  cramps involving the right foot and leg, but this has not been an issue over the last 6 months.  The patient denies any weakness of the extremities or any problems with falls or severe balance issues although at times she feels that her balance is slightly off.  She denies issues controlling the bowels or the bladder, she does have some constipation issues at times.  She has some achy pain in the hands, but no numbness.  At times, she may take a half of a Vicodin tablet at night to help her rest.  She has a history of restless leg syndrome in the past, but not currently.  She is sent to this office for further evaluation.   REVIEW OF SYSTEMS: Out of a complete 14 system review of symptoms, the patient complains only of the following symptoms, numbness and tingling of feet, leg cramps and all other reviewed systems are negative.   ALLERGIES: Allergies  Allergen Reactions   Actifed Cold-Allergy  [Chlorpheniramine-Phenyleph Er] Other (See Comments)    tachycardia   Ampicillin Other (See Comments)    Causes yeast   Beef-Derived Drug Products Other (See Comments)    Mood swings (no longer a problem ).     Benadryl [Diphenhydramine] Other (See Comments)    Restless legs   Darvocet [Propoxyphene N-Acetaminophen ] Other (See Comments)    Floating sensation   Darvon Compound Other (See Comments)    Floating sensation   Ibuprofen  Fluid retention   Indomethacin Other (See Comments)    Diarrhea, burning sensation lower abd   Mobic [Meloxicam]     Significant fluid retention   Penicillins Other (See Comments)    Yeast infection   Tofranil-Pm Other (See Comments)    Drug educed hepatitis   Ultram [Tramadol Hcl] Other (See Comments)    Severe syncope     HOME MEDICATIONS: Outpatient Medications Prior to Visit  Medication Sig Dispense Refill   Alpha-Lipoic Acid 300 MG CAPS Take 600 mg by mouth daily.     amphetamine -dextroamphetamine  (ADDERALL) 5 MG tablet Take 5 mg by mouth as needed.       b complex vitamins capsule Take 1 capsule by mouth daily.     budesonide (RHINOCORT AQUA) 32 MCG/ACT nasal spray Place 1 spray into both nostrils as needed.     Calcium Carbonate Antacid (TUMS E-X 750 PO) Take 4 capsules by mouth daily.     Cholecalciferol (VITAMIN D) 125 MCG (5000 UT) CAPS      cyclobenzaprine  (FLEXERIL ) 10 MG tablet Take 10 mg by mouth at bedtime as needed (fibromyalgia).      fluticasone  (FLONASE ) 50 MCG/ACT nasal spray Place 1 spray into both nostrils as needed.     gabapentin  (NEURONTIN ) 300 MG capsule Takes 300mg  po am and 900 mg po pm 360 capsule 0   HYDROcodone -acetaminophen  (NORCO/VICODIN) 5-325 MG per tablet Take 1-2 tablets by mouth every 4 (four) hours as needed (mild pain). 60 tablet 0   ipratropium (ATROVENT ) 0.06 % nasal spray USE 2 SPRAYS IN EACH NOSTRIL 3 TO 4 TIMES A DAY AS NEEDED FOR NASAL DRAINAGE. 15 mL 5   loratadine (CLARITIN) 10 MG tablet Take 10 mg by mouth daily as needed for allergies.     LORazepam  (ATIVAN ) 1 MG tablet Take 1 mg by mouth at bedtime as needed for sleep.      losartan  (COZAAR ) 50 MG tablet Take 50 mg by mouth daily.     Magnesium  500 MG TABS Take 500 mg by mouth every other day.     melatonin 5 MG TABS Take 5 mg by mouth at bedtime.     metFORMIN (GLUCOPHAGE-XR) 500 MG 24 hr tablet SMARTSIG:2 Tablet(s) By Mouth Every Evening     omeprazole  (PRILOSEC) 20 MG capsule TAKE 1 CAPSULE (20 MG TOTAL) BY MOUTH DAILY. 90 capsule 3   vitamin E 180 MG (400 UNITS) capsule      Zinc 30 MG CAPS Take 1 capsule by mouth daily at 6 (six) AM.     No facility-administered medications prior to visit.     PAST MEDICAL HISTORY: Past Medical History:  Diagnosis Date   Allergic rhinitis    Anemia    prior to  hysterectomy   Anxiety    takes lorazepam  for  prn anxiety   Cardiac arrhythmia due to congenital heart disease    pt denies   Colon polyp    Complication of anesthesia    Depression    history of  depression while caring for ill mother    Depression    Diabetic peripheral neuropathy (HCC) 10/17/2020   Eczema    Fatty liver    Fibromyalgia    Gallstones    GERD (gastroesophageal reflux disease)    H/O colonoscopy with polypectomy    H/O seasonal allergies    Hemochromatosis    compound heterozygous   Hepatitis    drug induced elevated liver enzymes   High blood pressure  History of recurrent UTI (urinary tract infection)    due to urethral stricture;; s/p dilatation 25 years ago   HTN (hypertension)    Insomnia    Obesity    Ovarian cyst    PONV (postoperative nausea and vomiting)    Sciatica      PAST SURGICAL HISTORY: Past Surgical History:  Procedure Laterality Date   APPENDECTOMY  1994   CESAREAN SECTION  1983   COLONOSCOPY  02/03/11   diminutive adenoma and hemorrhoids   EXPLORATORY LAPAROTOMY   07/15/2010     Lysis of adhesions, Bilateral salpingo-oophorectomy.    LAPAROSCOPIC CHOLECYSTECTOMY  03/17/00   with intraoperative cholangiogram.   LAPAROSCOPIC OOPHORECTOMY     LAPAROSCOPY  1982, 1990   LUMBAR LAMINECTOMY/DECOMPRESSION MICRODISCECTOMY Right 08/15/2014   Procedure: LUMBAR LAMINECTOMY/DECOMPRESSION MICRODISCECTOMY RIGHT LUMBAR FOUR-FIVE AND ,EXTRAFORAMINAL EXPLORATION;  Surgeon: Yvonna Herder, MD;  Location: [facility];  Service: Neurosurgery;  Laterality: Right;   TONSILLECTOMY AND ADENOIDECTOMY  1965   UPPER GASTROINTESTINAL ENDOSCOPY  02/03/11   53 [address] Maloney dilation (looked normal but dysphagia)   WISDOM TOOTH EXTRACTION  1972     FAMILY HISTORY: Family History  Problem Relation Age of Onset   Diabetes Mother        sister   Hypertension Mother        sister   Lung disease Mother    Osteoporosis Mother        brother   Basal cell carcinoma Mother    Mental illness Mother    Sleep apnea Sister        mother   Breast cancer Sister    Diabetes Sister    Kidney failure Brother    Heart disease Maternal Grandmother    Colon cancer Neg Hx    Liver disease Neg Hx     Esophageal cancer Neg Hx    Rectal cancer Neg Hx    Stomach cancer Neg Hx      SOCIAL HISTORY: Social History   Socioeconomic History   Marital status: Married    Spouse name: Not on file   Number of children: 1   Years of education: Not on file   Highest education level: Not on file  Occupational History   Occupation: retired  Tobacco Use   Smoking status: Former    Current packs/day: 0.00    Average packs/day: 0.2 packs/day for 8.0 years (1.6 ttl pk-yrs)    Types: Cigarettes    Start date: 03/29/2002    Quit date: 03/29/2010    Years since quitting: 13.3   Smokeless tobacco: Never  Vaping Use   Vaping status: Never Used  Substance and Sexual Activity   Alcohol use: Yes    Comment: ocassionaly   Drug use: No   Sexual activity: Not Currently  Other Topics Concern   Not on file  Social History Narrative   Married to Stage manager) [employer]   1 son   1-2 alcoholic beverages a month, coffee in the morning former smoker no tobacco now and no drug use   Social Drivers of Corporate investment banker Strain: Not on file  Food Insecurity: Not on file  Transportation Needs: Not on file  Physical Activity: Not on file  Stress: Not on file  Social Connections: Not on file  Intimate Partner Violence: Not on file     PHYSICAL EXAM  There were no vitals filed for this visit.   There  is no height or weight on file to calculate BMI.  Generalized: Well developed, in no acute distress  Cardiology: normal rate and rhythm, no murmur auscultated  Respiratory: clear to auscultation bilaterally    Neurological examination  Mentation: Alert oriented to time, place, history taking. Follows all commands speech and language fluent Cranial nerve II-XII: Pupils were equal round reactive to light. Extraocular movements were full, visual field were full on confrontational test. Facial sensation and strength were normal. Head  turning and shoulder shrug  were normal and symmetric. Motor: The motor testing reveals 5 over 5 strength of all 4 extremities. Good symmetric motor tone is noted throughout.  Gait and station: Gait is normal.    DIAGNOSTIC DATA (LABS, IMAGING, TESTING) - I reviewed patient records, labs, notes, testing and imaging myself where available.  Lab Results  Component Value Date   WBC 6.7 08/13/2014   HGB 13.0 08/20/2022   HCT 40.7 08/13/2014   MCV 91.7 08/13/2014   PLT 183 08/13/2014      Component Value Date/Time   NA 143 08/13/2014 1507   K 4.2 08/13/2014 1507   CL 108 08/13/2014 1507   CO2 27 08/13/2014 1507   GLUCOSE 122 (H) 08/13/2014 1507   BUN 13 08/13/2014 1507   CREATININE 0.65 08/13/2014 1507   CALCIUM 9.8 08/13/2014 1507   PROT 7.2 10/17/2020 1218   ALBUMIN 3.8 07/08/2010 1225   AST 42 (H) 07/08/2010 1225   ALT 35 07/08/2010 1225   ALKPHOS 45 07/08/2010 1225   BILITOT 0.4 07/08/2010 1225   GFRNONAA >60 08/13/2014 1507   GFRAA >60 08/13/2014 1507   No results found for: "CHOL", "HDL", "LDLCALC", "LDLDIRECT", "TRIG", "CHOLHDL" No results found for: "HGBA1C" Lab Results  Component Value Date   VITAMINB12 1,145 10/17/2020   No results found for: "TSH"      No data to display               No data to display           ASSESSMENT AND PLAN  73 y.o. year old female  has a past medical history of Allergic rhinitis, Anemia, Anxiety, Cardiac arrhythmia due to congenital heart disease, Colon polyp, Complication of anesthesia, Depression, Depression, Diabetic peripheral neuropathy (HCC) (10/17/2020), Eczema, Fatty liver, Fibromyalgia, Gallstones, GERD (gastroesophageal reflux disease), H/O colonoscopy with polypectomy, H/O seasonal allergies, Hemochromatosis, Hepatitis, High blood pressure, History of recurrent UTI (urinary tract infection), HTN (hypertension), Insomnia, Obesity, Ovarian cyst, PONV (postoperative nausea and vomiting), and Sciatica. here with     No diagnosis found.  Miranda Stark is doing well, today. We will continue gabapentin  300mg  in the morning and 900mg  at bedtime. She will continue alpha lipoic acid and complementary therapies if beneficial. She may reduce dose of gabapentin  slowly as discussed. Healthy lifestyle habits encouraged. She will follow up with me in 1 year, sooner if needed.    No orders of the defined types were placed in this encounter.    No orders of the defined types were placed in this encounter.     Terrilyn Fick, MSN, FNP-C 08/01/2023, 4:29 PM  [facility] [address], [address] [place], [place] [address] [phone]

## 2023-08-03 ENCOUNTER — Encounter: Payer: Self-pay | Admitting: Family Medicine

## 2023-08-03 ENCOUNTER — Ambulatory Visit: Payer: Medicare Other | Admitting: Family Medicine

## 2023-08-03 VITALS — BP 149/95 | HR 92 | Ht 64.0 in | Wt 182.0 lb

## 2023-08-03 DIAGNOSIS — Z7984 Long term (current) use of oral hypoglycemic drugs: Secondary | ICD-10-CM

## 2023-08-03 DIAGNOSIS — E1142 Type 2 diabetes mellitus with diabetic polyneuropathy: Secondary | ICD-10-CM | POA: Diagnosis not present

## 2023-08-03 MED ORDER — GABAPENTIN 300 MG PO CAPS: ORAL_CAPSULE | ORAL | 3 refills | Status: AC

## 2024-08-09 ENCOUNTER — Ambulatory Visit: Admitting: Family Medicine

## 2024-08-15 ENCOUNTER — Ambulatory Visit: Admitting: Family Medicine
